# Patient Record
Sex: Female | Born: 1965 | Hispanic: Yes | Marital: Single | Smoking: Never smoker
Health system: Southern US, Community
[De-identification: ages and names within clinical notes are randomized; demographics above are authoritative.]

## PROBLEM LIST (undated history)

## (undated) DIAGNOSIS — E119 Type 2 diabetes mellitus without complications: Secondary | ICD-10-CM

## (undated) DIAGNOSIS — I10 Essential (primary) hypertension: Secondary | ICD-10-CM

## (undated) DIAGNOSIS — C799 Secondary malignant neoplasm of unspecified site: Secondary | ICD-10-CM

## (undated) DIAGNOSIS — C73 Malignant neoplasm of thyroid gland: Secondary | ICD-10-CM

## (undated) HISTORY — PX: THYROIDECTOMY: SHX17

---

## 2006-10-04 DIAGNOSIS — C73 Malignant neoplasm of thyroid gland: Secondary | ICD-10-CM

## 2006-10-04 HISTORY — DX: Malignant neoplasm of thyroid gland: C73

## 2011-03-05 HISTORY — PX: THYROIDECTOMY: SHX17

## 2014-01-16 ENCOUNTER — Encounter (HOSPITAL_COMMUNITY): Payer: Self-pay | Admitting: Emergency Medicine

## 2014-01-16 ENCOUNTER — Other Ambulatory Visit (HOSPITAL_COMMUNITY)
Admission: RE | Admit: 2014-01-16 | Discharge: 2014-01-16 | Disposition: A | Discharge status: Home / Self Care | Payer: No Typology Code available for payment source | Source: Ambulatory Visit | Attending: Emergency Medicine | Admitting: Emergency Medicine

## 2014-01-16 ENCOUNTER — Emergency Department (HOSPITAL_COMMUNITY)
Admission: EM | Admit: 2014-01-16 | Discharge: 2014-01-16 | Disposition: A | Discharge status: Home / Self Care | Payer: No Typology Code available for payment source | Source: Home / Self Care | Attending: Emergency Medicine | Admitting: Emergency Medicine

## 2014-01-16 DIAGNOSIS — Z113 Encounter for screening for infections with a predominantly sexual mode of transmission: Secondary | ICD-10-CM | POA: Insufficient documentation

## 2014-01-16 DIAGNOSIS — N760 Acute vaginitis: Secondary | ICD-10-CM

## 2014-01-16 HISTORY — DX: Essential (primary) hypertension: I10

## 2014-01-16 LAB — POCT URINALYSIS DIP (DEVICE)
Bilirubin Urine: NEGATIVE
GLUCOSE, UA: 500 mg/dL — AB
Ketones, ur: NEGATIVE mg/dL
LEUKOCYTES UA: NEGATIVE
NITRITE: NEGATIVE
PROTEIN: NEGATIVE mg/dL
Specific Gravity, Urine: 1.01 (ref 1.005–1.030)
UROBILINOGEN UA: 0.2 mg/dL (ref 0.0–1.0)
pH: 5.5 (ref 5.0–8.0)

## 2014-01-16 LAB — GLUCOSE, CAPILLARY: GLUCOSE-CAPILLARY: 336 mg/dL — AB (ref 70–99)

## 2014-01-16 MED ORDER — FLUCONAZOLE 150 MG PO TABS: 150.0000 mg | ORAL_TABLET | Freq: Every day | ORAL | Status: DC

## 2014-01-16 MED ORDER — METRONIDAZOLE 500 MG PO TABS: 500.0000 mg | ORAL_TABLET | Freq: Two times a day (BID) | ORAL | Status: DC

## 2014-01-16 NOTE — Discharge Instructions (Signed)
Vaginosis bacteriana (Bacterial Vaginosis) La vaginosis bacteriana es una infeccin vaginal que perturba el equilibrio normal de las bacterias que se encuentran en la vagina. Es el resultado de un crecimiento excesivo de ciertas bacterias. Esta es la infeccin vaginal ms frecuente en mujeres en edad reproductiva. El tratamiento es importante para prevenir complicaciones, especialmente en mujeres embarazadas, dado que puede causar un parto prematuro. [name]  La vaginosis bacteriana se origina por un aumento de bacterias nocivas que, generalmente, estn presentes en cantidades ms pequeas en la vagina. Varios tipos diferentes de bacterias pueden causar esta afeccin. Sin embargo, la causa de su desarrollo no se comprende totalmente. [place] o comportamientos pueden exponerlo a un mayor riesgo de desarrollar vaginosis bacteriana, entre los que se incluyen:  Tener una nueva pareja sexual o mltiples parejas sexuales.  Las duchas vaginales  El uso del DIU (dispositivo intrauterino) como mtodo anticonceptivo. El contagio no se produce en baos, por ropas de cama, en piscinas o por contacto con objetos. SIGNOS Y SNTOMAS  Algunas mujeres que padecen vaginosis bacteriana no presentan signos ni sntomas. Los sntomas ms comunes son:  Secrecin vaginal de color grisceo.  Secrecin vaginal con olor similar al [employer], especialmente despus de Retail banker.  Picazn o sensacin de ardor en la vagina o la vulva.  Ardor o dolor al [employer]. DIAGNSTICO  Su mdico analizar su historia clnica y le examinar la vagina para detectar signos de vaginosis bacteriana. Puede tomarle Truddie Coco de flujo vaginal. Su mdico examinar esta muestra con un microscopio para controlar las bacterias y clulas anormales. Tambin puede realizarse un anlisis del pH vaginal.  TRATAMIENTO  La vaginosis bacteriana puede tratarse con antibiticos, en forma de comprimidos o  de crema vaginal. Puede indicarse una segunda tanda de antibiticos si la afeccin se repite despus del tratamiento.  [place] solo medicamentos de venta libre o recetados, segn las indicaciones del mdico.  Si le han recetado antibiticos, tmelos como se le indic. Asegrese de que finaliza la prescripcin completa aunque se sienta mejor.  No mantenga relaciones sexuales Animator.  Comunique a sus compaeros sexuales que sufre una infeccin vaginal. Deben consultar a su mdico y recibir tratamiento si tienen problemas, como picazn o una erupcin cutnea leve.  Practique el sexo seguro usando preservativos y tenga un nico compaero sexual. SOLICITE ATENCIN MDICA SI:   Sus sntomas no mejoran despus de 3 das de [place].  Aumenta la secrecin o Conservation officer, historic buildings.  Tiene fiebre. ASEGRESE DE QUE:   Comprende estas instrucciones.  Controlar su afeccin.  Recibir ayuda de inmediato si no mejora o si empeora. PARA OBTENER MS INFORMACIN  Centros para el control y la prevencin de Probation officer for Disease Control and Prevention, CDC): App[employer]raud[employer] Asociacin Estadounidense de la Salud Sexual (American Sexual Health Association, SHA): www.ashastd.org  Document Released: 12/28/2007 Document Revised: 07/11/2013 [facility] Patient Information 2014 [place], [name].  Vaginitis  (Vaginitis)  La vaginitis es la inflamacin de la vagina. Generalmente se debe a un cambio en el equilibrio normal de las bacterias y hongos que viven en la vagina. Este cambio en el equilibrio causa un crecimiento excesivo de ciertas bacterias y hongos, lo que causa la inflamacin. Hay diferentes tipos de vaginitis, pero los ms comunes son:   Vaginitis bacteriana.  Infeccin por hongos (candidiasis).  Vaginitis por tricomoniasis. Esta es una enfermedad de transmisin sexual (ETS).  Vaginitis viral.  Vaginitis atrfica.  Vaginitis  alrgica. [name]  El tratamiento depende del tipo de vaginitis. Las causas pueden ser:   Bacterias (vaginitis bacteriana).  Hongos (infeccin por hongos).  Parsitos (vaginitis por tricomoniasis).  Virus (vaginitis viral).  Niveles hormonales bajos (vaginitis atrfica). Los niveles bajos de hormonas pueden ocurrir durante el Media planner, la Transport planner o despus de la menopausia.  Irritantes, como los baos de [place], los tampones perfumados y los aerosoles femeninos (vaginitis IT consultant). Otros factores pueden alterar el equilibrio normal de los hongos y las bacterias que viven en la vagina. Ellos son:   Antibiticos.  Higiene personal deficiente.  Diafragmas, esponjas vaginales, espermicidas, pldoras anticonceptivas y dispositivos intrauterinos (DIU).  [place].  Infecciones.  La diabetes no controlada.  Tener un sistema inmunolgico debilitado. SNTOMAS  Los sntomas pueden variar segn la causa de la vaginitis. Los sntomas ms comunes son:   Flujo vaginal anormal.  La secrecin es de color blanco, gris o amarillento en la vaginitis bacteriana.  La secrecin es espesa, blanca y con apariencia de queso en la infeccin por hongos.  La secrecin es espumosa y de color amarillo o verdoso en la tricomoniasis.  Mal olor vaginal.  En la vaginitis bacteriana puede haber olor a "pescado".  Picazn, dolor o hinchazn vaginal.  Relaciones sexuales dolorosas.  Dolor o ardor al Garment/textile technologist. En ocasiones puede no haber sntomas.  TRATAMIENTO  El tratamiento depende de la gravedad de la lesin.   La vaginitis bacteriana y la tricomoniasis a menudo se tratan con cremas o comprimidos antibiticos.  Las infecciones por hongos se tratan con medicamentos antifngicos, como cremas o supositorios vaginales.  La vaginitis viral no tiene [place], [employer] los sntomas pueden tratarse con medicamentos que [employer]. Su pareja sexual tambin debe ser tratarse.  La  vaginitis atrfica puede tratarse con crema, comprimidos, supositorios, o anillo vaginal con estrgenos. Si tiene sequedad vaginal, los lubricantes y las cremas hidratantes pueden ayudarla. Posiblemente le pedirn que evite los [place], aerosoles o duchas perfumados.  El tratamiento de la vaginitis alrgica implica renunciar al uso del producto que est causando el problema. Las cremas vaginales pueden usarse para tratar los sntomas. [place] todos los medicamentos segn le indic su mdico.  Mantenga la zona vaginal limpia y seca. Evite el jabn y slo enjuague el rea con agua.  Evite la ducha vaginal. Puede eliminar las bacterias saludables que hay en la vagina.  No utilice tampones ni tenga relaciones sexuales hasta que el profesional la autorice. No use apsitos mientras tenga vaginitis.  Higiencese de adelante hacia atrs. Esto evita la propagacin de bacterias desde el recto hacia la vagina.  Deje que el aire llegue a su rea genital.  Use ropa interior de algodn para reducir la acumulacin de humedad.  Evite el uso de ropa interior cuando duerme hasta que la vaginitis haya mejorado.  Evite la ropa interior o medias de nylon ajustadas y que no tengan un panel de algodn.  Qutese la ropa hmeda (especialmente el traje de bao) tan pronto como sea posible.  Utilice productos suaves sin perfume. Evite el uso de sustancias irritantes como:  Aerosoles femeninos perfumados.  Suavizantes de tela.  Detergentes perfumados.  Tampones perfumados.  Jabones o baos de espuma perfumados.  Practique el sexo seguro y use condones. Los condones pueden prevenir la transmisin de la tricomoniasis y la vaginitis viral. Dana Allan ATENCIN MDICA SI:   Siente dolor abdominal.  Tiene fiebre o sntomas persistentes durante ms de 2  3 das.  Tiene fiebre  y los sntomas empeoran repentinamente. Document Released: 01/06/2009 Document Revised:  06/14/2012 [facility] Patient Information 2014 [place], [name].

## 2014-01-16 NOTE — ED Notes (Signed)
Patient c/o 1 week duration of vaginal itching, rectal bleeding (?from hemorrhoids ?) NAD. Also c/o HA, cramping pain in between shoulders

## 2014-01-16 NOTE — ED Provider Notes (Signed)
CSN: [id number]     Arrival date & time 01/16/14  1231 History   First MD Initiated Contact with Patient 01/16/14 1404     Chief Complaint  Patient presents with  . Vaginal Itching   (Consider location/radiation/quality/duration/timing/severity/associated sxs/prior Treatment) HPI Comments: Reports one week of vaginal itching and irritation with associated dysuria. Denies vaginal discharge or vaginal bleeding. Denies fever, chills, flank pain, abdominal pain. States she is not currently receiving any sort of cancer treatments. Is a diabetic and has not yet taken her metformin nor her BP medication for today.  Also wishes to mention a one year history of recurrent headache and discomfort in midportion of her back. States she has discussed this with one of her providers (her oncologist) and she has undergone imaging of these areas of discomfort and was informed that these issues would need to be managed by her PCP.  Has been taking ibuprofen with some relief.  PCP: [facility] Oncologist @ [place] (being treated for thyroid cancer).   Patient is a 48 y.o. female presenting with vaginal itching. The history is provided by the patient. The history is limited by a language barrier. A language interpreter was used.  Vaginal Itching This is a new problem. Episode onset: 1 week. The problem occurs constantly. The problem has not changed since onset.Associated symptoms include headaches.    Past Medical History  Diagnosis Date  . Hypertension    Past Surgical History  Procedure Laterality Date  . Thyroidectomy    . Cesarean section     History reviewed. No pertinent family history. History  Substance Use Topics  . Smoking status: Never Smoker   . Smokeless tobacco: Not on file  . Alcohol Use: No   OB History   Grav Para Term Preterm Abortions TAB SAB Ect Mult Living                 Review of Systems  Constitutional: Negative.   Eyes: Negative.   Respiratory: Negative.   Cardiovascular:  Negative.   Gastrointestinal: Negative.   Endocrine: Negative for polydipsia, polyphagia and polyuria.  Genitourinary: Positive for dysuria. Negative for urgency, frequency, hematuria, flank pain, decreased urine volume, vaginal bleeding, vaginal discharge, genital sores, vaginal pain and pelvic pain.  Musculoskeletal: Positive for back pain.  Skin: Negative.   Neurological: Positive for headaches. Negative for dizziness, seizures, syncope, weakness, light-headedness and numbness.    Allergies  Review of patient's allergies indicates no known allergies.  Home Medications   Prior to Admission medications   Medication Sig Start Date End Date Taking? Authorizing Provider  metFORMIN (GLUCOPHAGE) 1000 MG tablet Take 1,000 mg by mouth 2 (two) times daily with a meal.   Yes Historical Provider, MD  olmesartan (BENICAR) 20 MG tablet Take 20 mg by mouth daily.   Yes Historical Provider, MD  thyroid (ARMOUR) 30 MG tablet Take 30 mg by mouth daily before breakfast.   Yes Historical Provider, MD   BP 175/110  Pulse 73  Temp(Src) 98.2 F (36.8 C) (Oral)  Resp 16  SpO2 97% Physical Exam  Nursing note and vitals reviewed. Constitutional: She is oriented to person, place, and time. She appears well-developed and well-nourished. No distress.  HENT:  Head: Normocephalic and atraumatic.  Eyes: Conjunctivae and EOM are normal. Pupils are equal, round, and reactive to light. Right eye exhibits no discharge. Left eye exhibits no discharge. No scleral icterus.  Neck: Normal range of motion. Neck supple.  Cardiovascular: Normal rate, regular rhythm and normal heart sounds.  Pulmonary/Chest: Effort normal and breath sounds normal.  Abdominal: Soft. Bowel sounds are normal. She exhibits no distension and no mass. There is no tenderness.  Genitourinary: Uterus normal. Pelvic exam was performed with patient supine. There is no rash, tenderness or lesion on the right labia. There is no rash, tenderness or  lesion on the left labia. Cervix exhibits no motion tenderness, no discharge and no friability. Right adnexum displays no mass, no tenderness and no fullness. Left adnexum displays no mass, no tenderness and no fullness. No erythema, tenderness or bleeding around the vagina. No foreign body around the vagina. No signs of injury around the vagina. Vaginal discharge found.  Slight thin grey discharge.  Musculoskeletal: Normal range of motion.  Lymphadenopathy:    She has no cervical adenopathy.  Neurological: She is alert and oriented to person, place, and time. No cranial nerve deficit. Coordination normal.  Skin: Skin is warm and dry. No rash noted.  Psychiatric: She has a normal mood and affect. Her behavior is normal.    ED Course  Procedures (including critical care time) Labs Review Labs Reviewed  GLUCOSE, CAPILLARY - Abnormal; Notable for the following:    Glucose-Capillary 336 (*)    All other components within normal limits  POCT URINALYSIS DIP (DEVICE) - Abnormal; Notable for the following:    Glucose, UA 500 (*)    Hgb urine dipstick SMALL (*)    All other components within normal limits  CERVICOVAGINAL ANCILLARY ONLY    Results for orders placed during the hospital encounter of 01/16/14  GLUCOSE, CAPILLARY      Result Value Ref Range   Glucose-Capillary 336 (*) 70 - 99 mg/dL  POCT URINALYSIS DIP (DEVICE)      Result Value Ref Range   Glucose, UA 500 (*) NEGATIVE mg/dL   Bilirubin Urine NEGATIVE  NEGATIVE   Ketones, ur NEGATIVE  NEGATIVE mg/dL   Specific Gravity, Urine 1.010  1.005 - 1.030   Hgb urine dipstick SMALL (*) NEGATIVE   pH 5.5  5.0 - 8.0   Protein, ur NEGATIVE  NEGATIVE mg/dL   Urobilinogen, UA 0.2  0.0 - 1.0 mg/dL   Nitrite NEGATIVE  NEGATIVE   Leukocytes, UA NEGATIVE  NEGATIVE   Imaging Review No results found.   MDM   1. Vaginitis   Will treat for yeast and BV and contact patient if labs indicate need for additional treatment. Advised close  follow up with PCP if symptoms persist.    Lahoma Rocker, PA 01/16/14 [phone]

## 2014-01-17 LAB — CERVICOVAGINAL ANCILLARY ONLY
CHLAMYDIA, DNA PROBE: NEGATIVE
Neisseria Gonorrhea: NEGATIVE
Wet Prep (BD Affirm): NEGATIVE
Wet Prep (BD Affirm): NEGATIVE
Wet Prep (BD Affirm): POSITIVE — AB

## 2014-01-17 NOTE — ED Provider Notes (Signed)
Medical screening examination/treatment/procedure(s) were performed by non-physician practitioner and as supervising physician I was immediately available for consultation/collaboration.  Jadine Brumley, M.D.  Zeki Bedrosian C Candace Ramus, MD 01/17/14 1447 

## 2014-01-18 NOTE — ED Notes (Signed)
GC/Chlamydia neg., Affirm: Candida and Trich neg., Gardnerella pos.  Pt. adequately treated with Flagyl. Hanley Seamen [facility] 01/18/2014

## 2014-04-22 ENCOUNTER — Ambulatory Visit: Payer: No Typology Code available for payment source | Admitting: Family Medicine

## 2014-05-03 ENCOUNTER — Ambulatory Visit: Payer: No Typology Code available for payment source | Attending: Internal Medicine | Admitting: Internal Medicine

## 2014-05-03 ENCOUNTER — Encounter: Payer: Self-pay | Admitting: Internal Medicine

## 2014-05-03 VITALS — BP 150/90 | HR 77 | Temp 98.4°F | Resp 16 | Wt 140.4 lb

## 2014-05-03 DIAGNOSIS — E1165 Type 2 diabetes mellitus with hyperglycemia: Secondary | ICD-10-CM | POA: Insufficient documentation

## 2014-05-03 DIAGNOSIS — M545 Low back pain, unspecified: Secondary | ICD-10-CM

## 2014-05-03 DIAGNOSIS — E139 Other specified diabetes mellitus without complications: Secondary | ICD-10-CM

## 2014-05-03 DIAGNOSIS — M62830 Muscle spasm of back: Secondary | ICD-10-CM | POA: Insufficient documentation

## 2014-05-03 DIAGNOSIS — G8929 Other chronic pain: Secondary | ICD-10-CM | POA: Insufficient documentation

## 2014-05-03 DIAGNOSIS — Z85850 Personal history of malignant neoplasm of thyroid: Secondary | ICD-10-CM | POA: Insufficient documentation

## 2014-05-03 DIAGNOSIS — H538 Other visual disturbances: Secondary | ICD-10-CM | POA: Insufficient documentation

## 2014-05-03 DIAGNOSIS — I10 Essential (primary) hypertension: Secondary | ICD-10-CM

## 2014-05-03 DIAGNOSIS — E089 Diabetes mellitus due to underlying condition without complications: Secondary | ICD-10-CM

## 2014-05-03 DIAGNOSIS — E039 Hypothyroidism, unspecified: Secondary | ICD-10-CM

## 2014-05-03 DIAGNOSIS — M5380 Other specified dorsopathies, site unspecified: Secondary | ICD-10-CM

## 2014-05-03 DIAGNOSIS — IMO0001 Reserved for inherently not codable concepts without codable children: Secondary | ICD-10-CM

## 2014-05-03 DIAGNOSIS — R030 Elevated blood-pressure reading, without diagnosis of hypertension: Secondary | ICD-10-CM

## 2014-05-03 DIAGNOSIS — Z139 Encounter for screening, unspecified: Secondary | ICD-10-CM | POA: Insufficient documentation

## 2014-05-03 LAB — GLUCOSE, POCT (MANUAL RESULT ENTRY): POC Glucose: 110 mg/dl — AB (ref 70–99)

## 2014-05-03 LAB — POCT GLYCOSYLATED HEMOGLOBIN (HGB A1C): HEMOGLOBIN A1C: 6.2

## 2014-05-03 MED ORDER — CYCLOBENZAPRINE HCL 10 MG PO TABS: 10.0000 mg | ORAL_TABLET | Freq: Three times a day (TID) | ORAL | Status: DC | PRN

## 2014-05-03 MED ORDER — CLONIDINE HCL 0.1 MG PO TABS
0.1000 mg | ORAL_TABLET | Freq: Once | ORAL | Status: AC
  Administered 2014-05-03: 0.1 mg via ORAL

## 2014-05-03 MED ORDER — LEVOTHYROXINE SODIUM 200 MCG PO TABS: 200.0000 ug | ORAL_TABLET | Freq: Every day | ORAL | Status: DC

## 2014-05-03 MED ORDER — METFORMIN HCL 1000 MG PO TABS: 1000.0000 mg | ORAL_TABLET | Freq: Two times a day (BID) | ORAL | Status: DC

## 2014-05-03 MED ORDER — FREESTYLE SYSTEM KIT: 1.0000 | PACK | Freq: Three times a day (TID) | Status: DC

## 2014-05-03 MED ORDER — OLMESARTAN MEDOXOMIL 20 MG PO TABS: 20.0000 mg | ORAL_TABLET | Freq: Every day | ORAL | Status: DC

## 2014-05-03 NOTE — Patient Instructions (Signed)
Plan de alimentacin DASH (DASH Eating Plan) DASH es la sigla en ingls de "Enfoques Alimentarios para Detener la Hipertensin". El plan de alimentacin DASH ha demostrado bajar la presin arterial elevada (hipertensin). Los beneficios adicionales para la salud pueden incluir la disminucin del riesgo de diabetes mellitus tipo2, enfermedades cardacas e ictus. Este plan tambin puede ayudar a Horticulturist, commercial. QU DEBO SABER ACERCA DEL PLAN DE ALIMENTACIN DASH? Para el plan de alimentacin DASH, seguir las siguientes pautas generales:  Elija los alimentos con un valor porcentual diario de sodio de menos del 5% (segn figura en la etiqueta del alimento).  Use hierbas o aderezos sin sal, en lugar de sal de mesa o sal marina.  Consulte al mdico o farmacutico antes de usar sustitutos de la sal.  Coma productos con bajo contenido de sodio, cuya etiqueta suele decir "bajo contenido de sodio" o "sin agregado de sal".  Coma alimentos frescos.  Coma ms verduras, frutas y productos lcteos con bajo contenido de [place].  Elija los cereales integrales. Busque la palabra "integral" en Equities trader de la lista de ingredientes.  Elija el pescado y el pollo o el pavo sin piel ms a menudo que las carnes rojas. Limite el consumo de pescado, carne de ave y carne a 6onzas (170g) por Training and development officer.  Limite el consumo de dulces, postres, azcares y bebidas azucaradas.  Elija las grasas saludables para el corazn.  Limite el consumo de queso a 1onza (28g) por Training and development officer.  Consuma ms comida casera y menos de restaurante, de buf y comida rpida.  Limite el consumo de alimentos fritos.  Cocine los alimentos utilizando mtodos que no sean la fritura.  Limite las verduras enlatadas. Si las consume, enjuguelas bien para disminuir el sodio.  Cuando coma en un restaurante, pida que preparen su comida con menos sal o, en lo posible, sin nada de sal. QU ALIMENTOS PUEDO COMER? Pida ayuda a un nutricionista para  conocer las necesidades calricas individuales. Cereales Pan de salvado o integral. Arroz integral. Pastas de salvado o integrales. Quinua, trigo burgol y cereales integrales. Cereales con bajo contenido de sodio. Tortillas de harina de maz o de salvado. Pan de maz integral. Galletas saladas integrales. Galletas con bajo contenido de [place]. Vegetales Verduras frescas o congeladas (crudas, al vapor, asadas o grilladas). Jugos de tomate y verduras con contenido bajo o reducido de sodio. Pasta y salsa de tomate con contenido bajo o [place]. Verduras enlatadas con bajo contenido de sodio o reducido de sodio.  Lambert Mody Lambert Mody frescas, en conserva (en su jugo natural) o frutas congeladas. Carnes y otros productos con protenas Carne de res molida (al 85% o ms [place]), carne de res de animales alimentados con pastos o carne de res sin la grasa. Pollo o pavo sin piel. Carne de pollo o de [place]. Cerdo sin la grasa. Todos los pescados y frutos de mar. Huevos. Porotos, guisantes o lentejas secos. Frutos secos y semillas sin sal. Frijoles enlatados sin sal. Lcteos Productos lcteos con bajo contenido de grasas, como [place] o al 1%, quesos reducidos en grasas o al 2%, ricota con bajo contenido de grasas o [employer], o yogur natural con bajo contenido de [place]. Quesos con contenido bajo o reducido de sodio. Grasas y Naval architect en barra que no contengan grasas trans. [place] y alios para ensaladas livianos o reducidos en grasas (reducidos en sodio). [place]. Aceites de crtamo, oliva o canola. [place] natural de man o almendra. Otros Palomitas de maz y pretzels sin sal.  Los artculos mencionados arriba pueden no ser [employer] de las bebidas o los alimentos recomendados. Comunquese con el nutricionista para conocer ms opciones. QU ALIMENTOS NO SE RECOMIENDAN? Cereales Pan blanco. Pastas blancas. Arroz blanco. Pan de maz refinado. Bagels y  croissants. Galletas saladas que contengan grasas trans. Vegetales Vegetales con crema o fritos. Verduras en [place]. Verduras enlatadas comunes. Pasta y salsa de tomate en lata comunes. Jugos comunes de tomate y de verduras. Lambert Mody Frutas secas. Fruta enlatada en almbar liviano o espeso. Jugo de frutas. Carnes y otros productos con protenas [place] de carne con Lobbyist. Costillas, alas de pollo, tocineta, salchicha, mortadela, salame, chinchulines, tocino, perros calientes, salchichas alemanas y embutidos envasados. Frutos secos y semillas con sal. Frijoles con sal en lata. Lcteos Leche entera o al 2%, crema, mezcla de [place] y crema, y queso crema. Yogur entero o endulzado. Quesos o queso azul con alto contenido de Physicist, medical. [place] no lcteas y coberturas batidas. Quesos procesados, quesos para untar o cuajadas. Condimentos Sal de cebolla y ajo, sal condimentada, sal de mesa y sal marina. Salsas en lata y envasadas. Salsa [place]. Salsa trtara. Salsa barbacoa. Salsa teriyaki. Salsa de soja, incluso la que tiene contenido reducido de [place]. Salsa de carne. Salsa de pescado. Salsa de [place]. Salsa rosada. Rbano picante. Ketchup y mostaza. Saborizantes y tiernizantes para carne. Caldo en cubitos. Salsa picante. Salsa tabasco. Adobos. Aderezos para tacos. Salsas. Grasas y aceites [place], [place] en barra, Sylvarena de [place], [place], [place] clarificada y Wendee Copp de tocino. Aceites de coco, de palmiste o de palma. Aderezos comunes para ensalada. Otros Pickles y [place]. Palomitas de maz y pretzels con sal. Los artculos mencionados arriba pueden no ser [employer] de las bebidas y los alimentos que se [name]. Comunquese con el nutricionista para obtener ms informacin. DNDE Dolan Amen MS INFORMACIN? [place], del Pulmn y de la Sangre (National Heart, Lung, and [place]):  [email or web address] Document Released: 09/09/2011 Document Revised: 02/04/2014 [facility] Patient Information 2015 [place], [name]. This information is not intended to replace advice given to you by your health care provider. Make sure you discuss any questions you have with your health care provider. La diabetes mellitus y los alimentos (Diabetes Mellitus and Food) Es importante que controle su nivel de azcar en la sangre (glucosa). El nivel de glucosa en sangre depende en gran medida de lo que usted come. Comer alimentos saludables en las cantidades [place] a lo largo del Training and development officer, aproximadamente a la misma hora [employer], lo ayudar a Chief Technology Officer su nivel de Multimedia programmer. Tambin puede ayudarlo a retrasar o Patent attorney de la diabetes mellitus. Comer de [employer] saludable incluso puede ayudarlo a Chartered loss adjuster de presin arterial y a Science writer o Theatre manager un peso saludable.  CMO PUEDEN AFECTARME LOS ALIMENTOS? Carbohidratos Los carbohidratos afectan el nivel de glucosa en sangre ms que cualquier otro tipo de alimento. El nutricionista lo ayudar a Teacher, adult education cuntos carbohidratos puede consumir en cada comida y ensearle a contarlos. El recuento de carbohidratos es importante para mantener la glucosa en sangre en un nivel saludable, en especial si utiliza insulina o toma determinados medicamentos para la diabetes mellitus. Alcohol El alcohol puede provocar disminuciones sbitas de la glucosa en sangre (hipoglucemia), en especial si utiliza insulina o toma determinados medicamentos para la diabetes mellitus. La hipoglucemia es una afeccin que puede poner en peligro la vida. Los sntomas de la hipoglucemia (somnolencia, mareos y Data processing manager) son similares a los sntomas de  haber consumido mucho alcohol.  Si el mdico lo autoriza a beber alcohol, hgalo con moderacin y siga estas pautas:  Las mujeres no deben beber ms de un trago por da, y los  hombres no deben beber ms de dos tragos por da. Un trago es igual a:  12 onzas (355 ml) de cerveza  5 onzas de vino (150 ml) de vino  1,5onzas (45ml) de bebidas espirituosas  No beba con el estmago vaco.  Mantngase hidratado. Beba agua, gaseosas dietticas o t helado sin azcar.  Las gaseosas comunes, los jugos y otros refrescos podran contener muchos carbohidratos y se deben contar. QU ALIMENTOS NO SE RECOMIENDAN? Cuando haga las elecciones de alimentos, es importante que recuerde que todos los alimentos son distintos. Algunos tienen menos nutrientes que otros por porcin, aunque podran tener la misma cantidad de caloras o carbohidratos. Es difcil darle al cuerpo lo que necesita cuando consume alimentos con menos nutrientes. Estos son algunos ejemplos de alimentos que debera evitar ya que contienen muchas caloras y carbohidratos, pero pocos nutrientes:  Grasas trans (la mayora de los alimentos procesados incluyen grasas trans en la etiqueta de Informacin nutricional).  Gaseosas comunes.  Jugos.  Caramelos.  Dulces, como tortas, pasteles, rosquillas y galletas.  Comidas fritas. QU ALIMENTOS PUEDO COMER? Consuma alimentos ricos en nutrientes, que nutrirn el cuerpo y lo mantendrn saludable. Los alimentos que debe comer tambin dependern de varios factores, como:  Las caloras que necesita.  Los medicamentos que toma.  Su peso.  El nivel de glucosa en sangre.  El nivel de presin arterial.  El nivel de colesterol. Tambin debe consumir una variedad de alimentos, como:  Protenas, como carne, aves, pescado, tofu, frutos secos y semillas (las protenas de animales magros son mejores).  Frutas.  Verduras.  Productos lcteos, como leche, queso y yogur (descremados son mejores).  Panes, granos, pastas, cereales, arroz y frijoles.  Grasas, como aceite de oliva, margarina sin grasas trans, aceite de canola, aguacate y aceitunas. TODOS LOS QUE  PADECEN DIABETES MELLITUS TIENEN EL MISMO PLAN DE COMIDAS? Dado que todas las personas que padecen diabetes mellitus son distintas, no hay un solo plan de comidas que funcione para todos. Es muy importante que se rena con un nutricionista que lo ayudar a crear un plan de comidas adecuado para usted. Document Released: 12/28/2007 Document Revised: 09/25/2013 ExitCare Patient Information 2015 [employer]. This information is not intended to replace advice given to you by your health care provider. Make sure you discuss any questions you have with your health care provider.  

## 2014-05-03 NOTE — Progress Notes (Signed)
Patient Demographics  Jenny [name], is a 48 y.o. female  PIR:[id number]  YTK:[id number]  DOB - 04/15/1966  CC:  Chief Complaint  Patient presents with  . Establish Care       HPI: Jenny [name] is a 48 y.o. female here today to establish medical care. History of thyroid cancer status post surgery on neck a few years ago currently patient following up with oncologist at [facility], patient is currently taking levothyroxine 200 mcg daily, she also history of diabetes hypertension and she ran out of her medications for the last 2 months today her blood pressure is elevated, she was given clonidine, repeat manual blood pressure is 150/90 she does report blurry vision on and off denies any headache dizziness chest and shortness of breath, patient is complaining of lower back pain for a few weeks denies any incontinence, she has been taking over-the-counter ibuprofen with some improvement in the symptoms. Patient has No headache, No chest pain, No abdominal pain - No Nausea, No new weakness tingling or numbness, No Cough - SOB.  No Known Allergies Past Medical History  Diagnosis Date  . Hypertension   . Cancer    Current Outpatient Prescriptions on File Prior to Visit  Medication Sig Dispense Refill  . fluconazole (DIFLUCAN) 150 MG tablet Take 1 tablet (150 mg total) by mouth daily. For one dose  1 tablet  0  . metroNIDAZOLE (FLAGYL) 500 MG tablet Take 1 tablet (500 mg total) by mouth 2 (two) times daily. X 7 days  14 tablet  0  . thyroid (ARMOUR) 30 MG tablet Take 30 mg by mouth daily before breakfast.       No current facility-administered medications on file prior to visit.   Family History  Problem Relation Age of Onset  . Diabetes Mother   . Hypertension Mother   . Diabetes Sister   . Cancer Sister    History   Social History  . Marital Status: Single    Spouse Name: N/A    Number of Children: N/A  . Years of Education: N/A   Occupational History  . Not on  file.   Social History Main Topics  . Smoking status: Never Smoker   . Smokeless tobacco: Not on file  . Alcohol Use: No  . Drug Use: Not on file  . Sexual Activity: Not on file   Other Topics Concern  . Not on file   Social History Narrative  . No narrative on file    Review of Systems: Constitutional: Negative for fever, chills, diaphoresis, activity change, appetite change and fatigue. HENT: Negative for ear pain, nosebleeds, congestion, facial swelling, rhinorrhea, neck pain, neck stiffness and ear discharge.  Eyes: Negative for pain, discharge, redness, itching and visual disturbance. Respiratory: Negative for cough, choking, chest tightness, shortness of breath, wheezing and stridor.  Cardiovascular: Negative for chest pain, palpitations and leg swelling. Gastrointestinal: Negative for abdominal distention. Genitourinary: Negative for dysuria, urgency, frequency, hematuria, flank pain, decreased urine volume, difficulty urinating and dyspareunia.  Musculoskeletal: Negative for back pain, joint swelling, arthralgia and gait problem. Neurological: Negative for dizziness, tremors, seizures, syncope, facial asymmetry, speech difficulty, weakness, light-headedness, numbness and headaches.  Hematological: Negative for adenopathy. Does not bruise/bleed easily. Psychiatric/Behavioral: Negative for hallucinations, behavioral problems, confusion, dysphoric mood, decreased concentration and agitation.    Objective:   Filed Vitals:   05/03/14 1535  BP: 150/90  Pulse:   Temp:   Resp:     Physical Exam: Constitutional: Patient  appears well-developed and well-nourished. No distress. HENT: Normocephalic, atraumatic, External right and left ear normal. Oropharynx is clear and moist.  Eyes: Conjunctivae and EOM are normal. PERRLA, no scleral icterus. Neck: Normal ROM. Neck supple. No JVD. No tracheal deviation. Old surgical scar on the neck CVS: RRR, S1/S2 +, no murmurs, no gallops,  no carotid bruit.  Pulmonary: Effort and breath sounds normal, no stridor, rhonchi, wheezes, rales.  Abdominal: Soft. BS +, no distension, tenderness, rebound or guarding.  Musculoskeletal: Normal range of motion. No edema and no tenderness. Lower spinal and paraspinal tenderness, with SLR test patient is complaining of back discomfort.  Neuro: Alert. Normal reflexes, muscle tone coordination. No cranial nerve deficit. Skin: Skin is warm and dry. No rash noted. Not diaphoretic. No erythema. No pallor. Psychiatric: Normal mood and affect. Behavior, judgment, thought content normal.  No results found for this basename: WBC, HGB, HCT, MCV, PLT   No results found for this basename: CREATININE, BUN, NA, K, CL, CO2    No results found for this basename: HGBA1C   Lipid Panel  No results found for this basename: chol, trig, hdl, cholhdl, vldl, ldlcalc       Assessment and plan:   1. Elevated blood pressure  - cloNIDine (CATAPRES) tablet 0.1 mg; Take 1 tablet (0.1 mg total) by mouth once. Repeat manual blood pressure is 150/90  2. 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 - Glucose (CBG) - HgB A1c - metFORMIN (GLUCOPHAGE) 1000 MG tablet; Take 1 tablet (1,000 mg total) by mouth 2 (two) times daily with a meal.  Dispense: 60 tablet; Refill: 3 - COMPLETE METABOLIC PANEL WITH GFR; Future - glucose monitoring kit (FREESTYLE) monitoring kit; 1 each by Does not apply route 4 (four) times daily - after meals and at bedtime. 1 month Diabetic Testing Supplies for QAC-QHS accuchecks.  Dispense: 1 each; Refill: 1  3. Essential hypertension, benign Resume back on Benicar, also advise for DASH diet. - olmesartan (BENICAR) 20 MG tablet; Take 1 tablet (20 mg total) by mouth daily.  Dispense: 30 tablet; Refill: 3  4. Unspecified  hypothyroidism Will resume patient back on levothyroxine and check her TSH 3 months. - levothyroxine (SYNTHROID, LEVOTHROID) 200 MCG tablet; Take 1 tablet (200 mcg total) by mouth daily before breakfast.  Dispense: 30 tablet; Refill: 3  5. History of thyroid cancer Currently following up with oncologist  6. Back muscle spasm Advised patient to apply heating pad, prescribed Flexeril - cyclobenzaprine (FLEXERIL) 10 MG tablet; Take 1 tablet (10 mg total) by mouth 3 (three) times daily as needed for muscle spasms.  Dispense: 30 tablet; Refill: 1  7. Chronic low back pain  - DG Lumbar Spine Complete; Future  8. Screening Ordered baseline fasting blood work - MM DIGITAL SCREENING BILATERAL; Future - Lipid panel; Future - TSH; Future - Vit D  25 hydroxy (rtn osteoporosis monitoring); Future - CBC with Differential; Future - COMPLETE METABOLIC PANEL WITH GFR; Future  9. Blurry vision  - Ambulatory referral to Ophthalmology      Health Maintenance  -Mammogram: ordered    Return in about 3 months (around 08/03/2014) for diabetes, hypertension, hypothyroid, BP check in 2 weeks/Nurse Visit.   Lorayne Marek, MD

## 2014-05-03 NOTE — Progress Notes (Signed)
Patient here with interpreter Here to establish care Has history of cancer that has metastasized to  Several areas Thyroid has been removed

## 2014-10-21 ENCOUNTER — Encounter (HOSPITAL_COMMUNITY): Payer: Self-pay | Admitting: *Deleted

## 2014-10-21 ENCOUNTER — Inpatient Hospital Stay (HOSPITAL_COMMUNITY)
Admission: EM | Admit: 2014-10-21 | Discharge: 2014-10-24 | DRG: 180 | Disposition: A | Discharge status: Home / Self Care | Payer: Medicaid Other | Attending: Internal Medicine | Admitting: Internal Medicine

## 2014-10-21 ENCOUNTER — Emergency Department (HOSPITAL_COMMUNITY): Payer: Medicaid Other

## 2014-10-21 DIAGNOSIS — C799 Secondary malignant neoplasm of unspecified site: Secondary | ICD-10-CM

## 2014-10-21 DIAGNOSIS — R079 Chest pain, unspecified: Secondary | ICD-10-CM | POA: Diagnosis present

## 2014-10-21 DIAGNOSIS — J189 Pneumonia, unspecified organism: Secondary | ICD-10-CM | POA: Diagnosis present

## 2014-10-21 DIAGNOSIS — R7989 Other specified abnormal findings of blood chemistry: Secondary | ICD-10-CM

## 2014-10-21 DIAGNOSIS — C73 Malignant neoplasm of thyroid gland: Secondary | ICD-10-CM

## 2014-10-21 DIAGNOSIS — E1165 Type 2 diabetes mellitus with hyperglycemia: Secondary | ICD-10-CM | POA: Diagnosis present

## 2014-10-21 DIAGNOSIS — Z85850 Personal history of malignant neoplasm of thyroid: Secondary | ICD-10-CM

## 2014-10-21 DIAGNOSIS — C7800 Secondary malignant neoplasm of unspecified lung: Principal | ICD-10-CM | POA: Diagnosis present

## 2014-10-21 DIAGNOSIS — I10 Essential (primary) hypertension: Secondary | ICD-10-CM | POA: Diagnosis present

## 2014-10-21 DIAGNOSIS — Z79899 Other long term (current) drug therapy: Secondary | ICD-10-CM

## 2014-10-21 DIAGNOSIS — Z9221 Personal history of antineoplastic chemotherapy: Secondary | ICD-10-CM

## 2014-10-21 DIAGNOSIS — E089 Diabetes mellitus due to underlying condition without complications: Secondary | ICD-10-CM

## 2014-10-21 DIAGNOSIS — E119 Type 2 diabetes mellitus without complications: Secondary | ICD-10-CM | POA: Diagnosis present

## 2014-10-21 DIAGNOSIS — R778 Other specified abnormalities of plasma proteins: Secondary | ICD-10-CM

## 2014-10-21 HISTORY — DX: Malignant neoplasm of thyroid gland: C73

## 2014-10-21 HISTORY — DX: Type 2 diabetes mellitus without complications: E11.9

## 2014-10-21 HISTORY — DX: Secondary malignant neoplasm of unspecified site: C79.9

## 2014-10-21 LAB — BASIC METABOLIC PANEL
ANION GAP: 9 (ref 5–15)
BUN: 10 mg/dL (ref 6–23)
CALCIUM: 9.4 mg/dL (ref 8.4–10.5)
CO2: 30 mmol/L (ref 19–32)
Chloride: 93 mEq/L — ABNORMAL LOW (ref 96–112)
Creatinine, Ser: 0.59 mg/dL (ref 0.50–1.10)
GFR calc non Af Amer: >90 mL/min (ref >90)
Glucose, Bld: 550 mg/dL — ABNORMAL HIGH (ref 70–99)
POTASSIUM: 4 mmol/L (ref 3.5–5.1)
Sodium: 132 mmol/L — ABNORMAL LOW (ref 135–145)

## 2014-10-21 LAB — CBC
HEMATOCRIT: 41.6 % (ref 36.0–46.0)
Hemoglobin: 15.3 g/dL — ABNORMAL HIGH (ref 12.0–15.0)
MCH: 31.2 pg (ref 26.0–34.0)
MCHC: 36.8 g/dL — ABNORMAL HIGH (ref 30.0–36.0)
MCV: 84.9 fL (ref 78.0–100.0)
PLATELETS: 188 10*3/uL (ref 150–400)
RBC: 4.9 MIL/uL (ref 3.87–5.11)
RDW: 11.7 % (ref 11.5–15.5)
WBC: 6.3 10*3/uL (ref 4.0–10.5)

## 2014-10-21 LAB — CBG MONITORING, ED: GLUCOSE-CAPILLARY: 360 mg/dL — AB (ref 70–99)

## 2014-10-21 LAB — TROPONIN I: TROPONIN I: 0.06 ng/mL — AB (ref <0.031)

## 2014-10-21 MED ORDER — IOHEXOL 350 MG/ML SOLN
100.0000 mL | Freq: Once | INTRAVENOUS | Status: AC | PRN
  Administered 2014-10-21: 100 mL via INTRAVENOUS

## 2014-10-21 MED ORDER — ASPIRIN 325 MG PO TABS
325.0000 mg | ORAL_TABLET | Freq: Once | ORAL | Status: AC
  Administered 2014-10-21: 325 mg via ORAL
  Filled 2014-10-21: qty 1

## 2014-10-21 MED ORDER — SODIUM CHLORIDE 0.9 % IV BOLUS (SEPSIS)
1000.0000 mL | Freq: Once | INTRAVENOUS | Status: AC
  Administered 2014-10-21: 1000 mL via INTRAVENOUS

## 2014-10-21 NOTE — ED Provider Notes (Signed)
CSN: [id number]     Arrival date & time 10/21/14  2119 History   First MD Initiated Contact with Patient 10/21/14 2155     Chief Complaint  Patient presents with  . Chest Pain     (Consider location/radiation/quality/duration/timing/severity/associated sxs/prior Treatment) The history is provided by the patient. A language interpreter was used ([place]).  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 Patient reported that she was relaxing when she noted the pain. Stated that the pain is intense and that she has used nothing for the pain. Reported that she has been experiencing shortness of breath associated with the pain. Stated that since this afternoon she has been having increased pain to the chest. Stated that at times the pain does radiate to the back inbetween her shoulder blades. Patient reported that she has been having a headache that started with the chest pain - stated that this headache is a typical headache that she normally gets - stated that there is a pressure behind her eyes. Stated that the headache started at 7:30 PM and has not changed - has not used anything for the pain. Stated that she has history of thyroid cancer primarily-reported that she did have chemoradiation approximately year ago in [place]. Denies oncologist here in [place]. Denied heavy lifting, fall, injury, breast pain, difficulty swallowing, fever, chills, nausea, vomiting, abdominal pain, weakness, numbness, tingling. PCP Dr. Annitta Needs     Past Medical History  Diagnosis Date  . Hypertension   . Cancer   . Diabetes mellitus without complication    Past Surgical History  Procedure Laterality Date  . Thyroidectomy    . Cesarean section     Family History  Problem Relation  Age of Onset  . Diabetes Mother   . Hypertension Mother   . Diabetes Sister   . Cancer Sister    History  Substance Use Topics  . Smoking status: Never Smoker   . Smokeless tobacco: Not on file  . Alcohol Use: No   OB History    No data available     Review of Systems  Constitutional: Negative for fever and chills.  Eyes: Positive for photophobia. Negative for visual disturbance.  Respiratory: Positive for shortness of breath. Negative for chest tightness.   Cardiovascular: Positive for chest pain.  Gastrointestinal: Negative for nausea, vomiting and abdominal pain.  Musculoskeletal: Positive for back pain and neck pain. Negative for neck stiffness.  Neurological: Positive for headaches. Negative for dizziness, weakness and numbness.      Allergies  Review of patient's allergies indicates no known allergies.  Home Medications   Prior to Admission medications   Medication Sig Start Date End Date Taking? Authorizing Provider  cyclobenzaprine (FLEXERIL) 10 MG tablet Take 1 tablet (10 mg total) by mouth 3 (three) times daily as needed for muscle spasms. 05/03/14   Lorayne Marek, MD  fluconazole (DIFLUCAN) 150 MG tablet Take 1 tablet (150 mg total) by mouth daily. For one dose 01/16/14   Audelia Hives Presson, PA  glucose monitoring kit (FREESTYLE) monitoring kit 1 each by Does not apply route 4 (four) times daily - after meals and at bedtime. 1 month Diabetic Testing Supplies for QAC-QHS accuchecks. 05/03/14   Lorayne Marek, MD  levothyroxine (SYNTHROID, LEVOTHROID) 200 MCG tablet Take 1 tablet (200 mcg total) by mouth daily before breakfast. 05/03/14   Lorayne Marek, MD  metFORMIN (GLUCOPHAGE) 1000 MG tablet Take 1 tablet (1,000 mg total) by mouth 2 (two) times daily with a meal. 05/03/14   Lorayne Marek, MD  metroNIDAZOLE (FLAGYL) 500 MG tablet Take 1 tablet (500 mg total) by mouth 2 (two) times daily. X 7 days 01/16/14   Audelia Hives Presson, PA  olmesartan (BENICAR) 20 MG tablet  Take 1 tablet (20 mg total) by mouth daily. 05/03/14   Lorayne Marek, MD  thyroid (ARMOUR) 30 MG tablet Take 30 mg by mouth daily before breakfast.    Historical Provider, MD   BP 200/94 mmHg  Pulse 70  Temp(Src) 97.7 F (36.5 C) (Oral)  Resp 17  Wt 140 lb (63.504 kg)  SpO2 98% Physical Exam  Constitutional: She is oriented to person, place, and time. She appears well-developed and well-nourished. No distress.  HENT:  Head: Normocephalic and atraumatic.  Mouth/Throat: Oropharynx is clear and moist. No oropharyngeal exudate.  Eyes: Conjunctivae and EOM are normal. Pupils are equal, round, and reactive to light. Right eye exhibits no discharge. Left eye exhibits no discharge.  Patient refuses to open eye secondary to pain and tearing.   Neck: Normal range of motion. Neck supple. No tracheal deviation present.  Negative neck stiffness Negative nuchal rigidity  Negative cervical lymphadenopathy  Negative meningeal signs  Cardiovascular: Normal rate, regular rhythm and normal heart sounds.  Exam reveals no friction rub.   No murmur heard. Pulmonary/Chest: Effort normal and breath sounds normal. No respiratory distress. She has no wheezes. She has no rales. She exhibits tenderness.    Patient is able to speak in full sentences without difficulty  Negative use of accessory muscles Negative stridor  Negative deformities, erythema, inflammation, lesions, sores, ecchymosis identified to chest wall. Pain reproducible upon palpation to left side of the chest.  Musculoskeletal: Normal range of motion.  Full ROM to upper and lower extremities without difficulty noted, negative ataxia noted.  Lymphadenopathy:    She has no cervical adenopathy.  Neurological: She is alert and oriented to person, place, and time. No cranial nerve deficit. She exhibits normal muscle tone. Coordination normal.  Cranial nerves III-XII grossly intact Strength 5+/5+ to upper and lower extremities bilaterally with  resistance applied, equal distribution noted Equal grip strength bilaterally Negative facial droop Negative slurred speech Negative aphasia Patient is able to bring finger to nose bilaterally without difficulty or ataxia Negative arm drift Patient follows commands well Patient responds to questions appropriately GCS 15  Skin: Skin is warm and dry. No rash noted. She is not diaphoretic. No erythema.  Psychiatric: She has a normal mood and affect. Her behavior is normal. Thought content normal.  Nursing note and vitals reviewed.   ED Course  Procedures (including critical care time)  Results for orders placed or performed during the hospital encounter of 10/21/14  CBC  Result Value Ref Range   WBC 6.3 4.0 - 10.5 K/uL   RBC 4.90 3.87 - 5.11 MIL/uL   Hemoglobin 15.3 (H) 12.0 - 15.0 g/dL   HCT 41.6 36.0 - 46.0 %   MCV 84.9 78.0 - 100.0 fL   MCH 31.2 26.0 - 34.0 pg   MCHC 36.8 (H) 30.0 - 36.0 g/dL   RDW 11.7 11.5 - 15.5 %   Platelets 188 150 - 400 K/uL  Basic metabolic panel  Result Value Ref Range   Sodium  132 (L) 135 - 145 mmol/L   Potassium 4.0 3.5 - 5.1 mmol/L   Chloride 93 (L) 96 - 112 mEq/L   CO2 30 19 - 32 mmol/L   Glucose, Bld 550 (H) 70 - 99 mg/dL   BUN 10 6 - 23 mg/dL   Creatinine, Ser 0.59 0.50 - 1.10 mg/dL   Calcium 9.4 8.4 - 10.5 mg/dL   GFR calc non Af Amer >90 >90 mL/min   GFR calc Af Amer >90 >90 mL/min   Anion gap 9 5 - 15  Troponin I  Result Value Ref Range   Troponin I 0.06 (H) <0.031 ng/mL  CBG monitoring, ED  Result Value Ref Range   Glucose-Capillary 360 (H) 70 - 99 mg/dL    Labs Review Labs Reviewed  CBC - Abnormal; Notable for the following:    Hemoglobin 15.3 (*)    MCHC 36.8 (*)    All other components within normal limits  BASIC METABOLIC PANEL - Abnormal; Notable for the following:    Sodium 132 (*)    Chloride 93 (*)    Glucose, Bld 550 (*)    All other components within normal limits  TROPONIN I - Abnormal; Notable for the  following:    Troponin I 0.06 (*)    All other components within normal limits  CBG MONITORING, ED - Abnormal; Notable for the following:    Glucose-Capillary 360 (*)    All other components within normal limits  CULTURE, BLOOD (ROUTINE X 2)  CULTURE, BLOOD (ROUTINE X 2)  HEPARIN LEVEL (UNFRACTIONATED)    Imaging Review Dg Chest 2 View  10/21/2014   CLINICAL DATA:  Chest, arm and jaw pain for 2 days.  EXAM: CHEST  2 VIEW  COMPARISON:  None.  FINDINGS: Cardiac silhouette normal in size and configuration. Normal mediastinal and hilar contours.  Clear lungs.  No pleural effusion or pneumothorax.  Surgical clips noted at the right neck base.  Bony thorax is unremarkable.  IMPRESSION: No active cardiopulmonary disease.   Electronically Signed   By: Lajean Manes M.D.   On: 10/21/2014 22:14   Ct Head Wo Contrast  10/21/2014   CLINICAL DATA:  Headache.  EXAM: CT HEAD WITHOUT CONTRAST  TECHNIQUE: Contiguous axial images were obtained from the base of the skull through the vertex without intravenous contrast.  COMPARISON:  None.  FINDINGS: Ventricles are normal in size and configuration. No parenchymal masses or mass effect. No evidence of an infarct. No extra-axial masses or abnormal fluid collections.  No intracranial hemorrhage.  Visualized sinuses and mastoid air cells are clear. No skull lesion.  IMPRESSION: Normal unenhanced CT scan the brain.   Electronically Signed   By: Lajean Manes M.D.   On: 10/21/2014 23:41   Ct Angio Chest Aorta W/cm &/or Wo/cm  10/21/2014   CLINICAL DATA:  Chest pain  EXAM: CT ANGIOGRAPHY CHEST, ABDOMEN AND PELVIS  TECHNIQUE: Multidetector CT imaging through the chest, abdomen and pelvis was performed using the standard protocol during bolus administration of intravenous contrast. Multiplanar reconstructed images and MIPs were obtained and reviewed to evaluate the vascular anatomy.  CONTRAST:  164m OMNIPAQUE IOHEXOL 350 MG/ML SOLN  COMPARISON:  None.  FINDINGS: CTA CHEST  FINDINGS  THORACIC INLET/BODY WALL:  Thyroidectomy and lower right neck dissection. Indeterminate tissue medial to the left common carotid artery, without definitive adenopathy. Given pulmonary findings, the will likely be updated dedicated staging.  MEDIASTINUM:  Normal heart size. No pericardial effusion. No acute vascular abnormality, including  central pulmonary embolism or aortic dissection. No aortic aneurysm or intramural hematoma. No adenopathy.  LUNG WINDOWS:  There are innumerable rounded pulmonary nodules throughout bilateral lungs. Most of the nodules are less than 1cm, with the largest in the left lower lobe on image 34 measuring 12 mm. In the bilateral apical upper lobes, there is also elongated ill-defined opacities with a small area of cavitation on the right. This is presumably pneumonia; no chart indication of neck radiation.  OSSEOUS:  No acute fracture.  No suspicious lytic or blastic lesions.  Review of the MIP images confirms the above findings.  CTA ABDOMEN AND PELVIS FINDINGS  BODY WALL: Unremarkable.  Liver: No focal abnormality.  Biliary: No evidence of biliary obstruction or stone.  Pancreas: Unremarkable.  Spleen: Unremarkable.  Adrenals: Unremarkable.  Kidneys and ureters: No hydronephrosis or stone.  Bladder: Unremarkable.  Reproductive: Unremarkable.  Bowel: No obstruction. No inflammatory bowel wall thickening.  Retroperitoneum: Prominent lymph nodes along the greater curvature of the stomach and in the gastrohepatic ligament, measuring approximately 1 cm in short axis. No inflammatory or neoplastic appearing gastric wall thickening.  Peritoneum: No ascites or pneumoperitoneum.  Vascular: Standard aortic branching. No significant atherosclerotic change. No aneurysm, dissection, or stenosis.  OSSEOUS: No acute abnormalities.  Review of the MIP images confirms the above findings.  IMPRESSION: 1. Negative aorta. 2. Biapical airspace disease with early cavitation, presumably pneumonia. 3.  Innumerable pulmonary nodules, likely metastases from the patient's treated thyroid cancer. 4. Mild perigastric adenopathy. Consider surveillance imaging or endoscopy.   Electronically Signed   By: Jorje Guild M.D.   On: 10/21/2014 23:53   Ct Angio Abd/pel W/ And/or W/o  10/21/2014   CLINICAL DATA:  Chest pain  EXAM: CT ANGIOGRAPHY CHEST, ABDOMEN AND PELVIS  TECHNIQUE: Multidetector CT imaging through the chest, abdomen and pelvis was performed using the standard protocol during bolus administration of intravenous contrast. Multiplanar reconstructed images and MIPs were obtained and reviewed to evaluate the vascular anatomy.  CONTRAST:  144m OMNIPAQUE IOHEXOL 350 MG/ML SOLN  COMPARISON:  None.  FINDINGS: CTA CHEST FINDINGS  THORACIC INLET/BODY WALL:  Thyroidectomy and lower right neck dissection. Indeterminate tissue medial to the left common carotid artery, without definitive adenopathy. Given pulmonary findings, the will likely be updated dedicated staging.  MEDIASTINUM:  Normal heart size. No pericardial effusion. No acute vascular abnormality, including central pulmonary embolism or aortic dissection. No aortic aneurysm or intramural hematoma. No adenopathy.  LUNG WINDOWS:  There are innumerable rounded pulmonary nodules throughout bilateral lungs. Most of the nodules are less than 1cm, with the largest in the left lower lobe on image 34 measuring 12 mm. In the bilateral apical upper lobes, there is also elongated ill-defined opacities with a small area of cavitation on the right. This is presumably pneumonia; no chart indication of neck radiation.  OSSEOUS:  No acute fracture.  No suspicious lytic or blastic lesions.  Review of the MIP images confirms the above findings.  CTA ABDOMEN AND PELVIS FINDINGS  BODY WALL: Unremarkable.  Liver: No focal abnormality.  Biliary: No evidence of biliary obstruction or stone.  Pancreas: Unremarkable.  Spleen: Unremarkable.  Adrenals: Unremarkable.  Kidneys and  ureters: No hydronephrosis or stone.  Bladder: Unremarkable.  Reproductive: Unremarkable.  Bowel: No obstruction. No inflammatory bowel wall thickening.  Retroperitoneum: Prominent lymph nodes along the greater curvature of the stomach and in the gastrohepatic ligament, measuring approximately 1 cm in short axis. No inflammatory or neoplastic appearing gastric wall thickening.  Peritoneum: No  ascites or pneumoperitoneum.  Vascular: Standard aortic branching. No significant atherosclerotic change. No aneurysm, dissection, or stenosis.  OSSEOUS: No acute abnormalities.  Review of the MIP images confirms the above findings.  IMPRESSION: 1. Negative aorta. 2. Biapical airspace disease with early cavitation, presumably pneumonia. 3. Innumerable pulmonary nodules, likely metastases from the patient's treated thyroid cancer. 4. Mild perigastric adenopathy. Consider surveillance imaging or endoscopy.   Electronically Signed   By: Jorje Guild M.D.   On: 10/21/2014 23:53      EKG Interpretation None      11:49 PM This provider spoke with Dr. Elias Else, Cardiologist - discussed case, labs, imaging, vitals in great detail. Reported that will see patient. Reported that this could be a possible PE.   12:16 AM Spoke with Dr. Elias Else who reviewed the imaging of the patient. Reported that he will come and assess the patient. Reported that patient does not appear to have a PE, or saddle PE. Recommended admission to Medicine. Cardiology to come and assess. Recommended Heparin to be started.   12:20 AM This provider spoke with dr. Dillon Bjork, [facility]. Discussed case, labs, imaging, vitals in great detail. Discussed with physician recommended from Cardiology. Patient to be admitted to [facility]. Physician to come and assess patient and stated that he will put in the antibiotics.   MDM   Final diagnoses:  Chest pain, unspecified chest pain type  Elevated troponin  Community acquired pneumonia     Medications  heparin bolus via infusion 3,000 Units (not administered)  heparin ADULT infusion 100 units/mL (25000 units/250 mL) (not administered)  sodium chloride 0.9 % bolus 1,000 mL (1,000 mLs Intravenous New Bag/Given 10/21/14 2257)  iohexol (OMNIPAQUE) 350 MG/ML injection 100 mL (100 mLs Intravenous Contrast Given 10/21/14 2313)  aspirin tablet 325 mg (325 mg Oral Given 10/21/14 2334)    Filed Vitals:   10/21/14 2215 10/21/14 2300 10/21/14 2336 10/21/14 2337  BP: 157/86 168/83 193/98 200/94  Pulse: 71 70    Temp:      TempSrc:      Resp: 17 17    Weight:      SpO2: 99% 98%     EKG noted ST segment depressions in the inferior leads with T-wave inversion in V3, heart rate 76 bpm. Troponin noted to be mildly elevated at 0.06. CBC negative elevated leukocytosis. Hemoglobin 15.3. BMP noted hyponatremia 132. Low chloride of 93. Glucose elevated at 550, negative elevated anion gap 9.0 mEq/L. Chest x-ray unremarkable. CT head unremarkable. CT angiogram of chest and abdomen negative for aortic dissection. No pericardial effusion. No acute vascular abnormalities, including central pulmonary embolism or aortic dissection. Biapical airspace disease with early cavitation-presumably pneumonia. Innumerable pulmonary nodules likely metastases from the patient's treated thyroid cancer. CT head negative for acute intracranial abnormalities. Negative findings of aortic dissection. Negative finding of saddle PE.  Beginnings of biapical airspace disease with early cavitation seen on CT - concerning. Elevated troponin identified at 0.06 with ST segment depression in inferior leads and T-wave inversion of V3. Cardiology consulted and recommended heparin to be started-heparin per pharmacy consult ordered - cardiology to come and assess patient. Patient to be admitted to stepdown secondary to being heparinized as well as early cavitation of biapical airspace disease. Blood cultures obtained. Patient stable,  afebrile. Patient stable for transfer.   Jamse Mead, PA-C 10/22/14 0033  Jamse Mead, PA-C 10/22/14 3149  Charlesetta Shanks, MD 10/23/14 [phone]

## 2014-10-21 NOTE — ED Notes (Signed)
Repeat EKG done for increased troponin

## 2014-10-21 NOTE — ED Notes (Signed)
The npt has had lt upper chest pain all day with some lt arm radiation.  The pain is worse with movement.  No cough

## 2014-10-22 ENCOUNTER — Encounter (HOSPITAL_COMMUNITY): Payer: Self-pay | Admitting: Family Medicine

## 2014-10-22 DIAGNOSIS — E119 Type 2 diabetes mellitus without complications: Secondary | ICD-10-CM

## 2014-10-22 DIAGNOSIS — R079 Chest pain, unspecified: Secondary | ICD-10-CM | POA: Diagnosis not present

## 2014-10-22 DIAGNOSIS — J189 Pneumonia, unspecified organism: Secondary | ICD-10-CM

## 2014-10-22 DIAGNOSIS — C7800 Secondary malignant neoplasm of unspecified lung: Secondary | ICD-10-CM | POA: Diagnosis present

## 2014-10-22 DIAGNOSIS — I10 Essential (primary) hypertension: Secondary | ICD-10-CM | POA: Diagnosis present

## 2014-10-22 DIAGNOSIS — R7989 Other specified abnormal findings of blood chemistry: Secondary | ICD-10-CM | POA: Insufficient documentation

## 2014-10-22 DIAGNOSIS — Z9221 Personal history of antineoplastic chemotherapy: Secondary | ICD-10-CM | POA: Diagnosis not present

## 2014-10-22 DIAGNOSIS — Z85850 Personal history of malignant neoplasm of thyroid: Secondary | ICD-10-CM | POA: Diagnosis not present

## 2014-10-22 DIAGNOSIS — E1165 Type 2 diabetes mellitus with hyperglycemia: Secondary | ICD-10-CM | POA: Diagnosis present

## 2014-10-22 DIAGNOSIS — Z79899 Other long term (current) drug therapy: Secondary | ICD-10-CM | POA: Diagnosis not present

## 2014-10-22 DIAGNOSIS — C799 Secondary malignant neoplasm of unspecified site: Secondary | ICD-10-CM

## 2014-10-22 DIAGNOSIS — R778 Other specified abnormalities of plasma proteins: Secondary | ICD-10-CM | POA: Insufficient documentation

## 2014-10-22 DIAGNOSIS — I214 Non-ST elevation (NSTEMI) myocardial infarction: Secondary | ICD-10-CM | POA: Diagnosis not present

## 2014-10-22 DIAGNOSIS — C73 Malignant neoplasm of thyroid gland: Secondary | ICD-10-CM

## 2014-10-22 LAB — HEPARIN LEVEL (UNFRACTIONATED)
Heparin Unfractionated: 0.31 IU/mL (ref 0.30–0.70)
Heparin Unfractionated: 0.27 IU/mL — ABNORMAL LOW (ref 0.30–0.70)

## 2014-10-22 LAB — PROCALCITONIN

## 2014-10-22 LAB — GLUCOSE, CAPILLARY
GLUCOSE-CAPILLARY: 287 mg/dL — AB (ref 70–99)
GLUCOSE-CAPILLARY: 96 mg/dL (ref 70–99)
GLUCOSE-CAPILLARY: 309 mg/dL — AB (ref 70–99)
GLUCOSE-CAPILLARY: 163 mg/dL — AB (ref 70–99)
Glucose-Capillary: 300 mg/dL — ABNORMAL HIGH (ref 70–99)

## 2014-10-22 LAB — COMPREHENSIVE METABOLIC PANEL
ALT: 24 U/L (ref 0–35)
ANION GAP: 9 (ref 5–15)
AST: 23 U/L (ref 0–37)
Albumin: 3.7 g/dL (ref 3.5–5.2)
Alkaline Phosphatase: 133 U/L — ABNORMAL HIGH (ref 39–117)
BUN: 5 mg/dL — ABNORMAL LOW (ref 6–23)
CALCIUM: 8.8 mg/dL (ref 8.4–10.5)
CHLORIDE: 102 meq/L (ref 96–112)
CO2: 27 mmol/L (ref 19–32)
CREATININE: 0.4 mg/dL — AB (ref 0.50–1.10)
GFR calc Af Amer: >90 mL/min (ref >90)
GFR calc non Af Amer: >90 mL/min (ref >90)
GLUCOSE: 326 mg/dL — AB (ref 70–99)
Potassium: 3.1 mmol/L — ABNORMAL LOW (ref 3.5–5.1)
SODIUM: 138 mmol/L (ref 135–145)
TOTAL PROTEIN: 6.6 g/dL (ref 6.0–8.3)
Total Bilirubin: 0.7 mg/dL (ref 0.3–1.2)

## 2014-10-22 LAB — CBC
HCT: 38.5 % (ref 36.0–46.0)
HEMOGLOBIN: 13.8 g/dL (ref 12.0–15.0)
MCH: 30.2 pg (ref 26.0–34.0)
MCHC: 35.8 g/dL (ref 30.0–36.0)
MCV: 84.2 fL (ref 78.0–100.0)
Platelets: 182 10*3/uL (ref 150–400)
RBC: 4.57 MIL/uL (ref 3.87–5.11)
RDW: 11.7 % (ref 11.5–15.5)
WBC: 6.7 10*3/uL (ref 4.0–10.5)

## 2014-10-22 LAB — TROPONIN I
TROPONIN I: 0.06 ng/mL — AB (ref <0.031)
TROPONIN I: 0.04 ng/mL — AB (ref <0.031)
Troponin I: 0.1 ng/mL — ABNORMAL HIGH (ref <0.031)

## 2014-10-22 LAB — HEMOGLOBIN A1C
HEMOGLOBIN A1C: 12.9 % — AB (ref <5.7)
MEAN PLASMA GLUCOSE: 324 mg/dL — AB (ref <117)

## 2014-10-22 LAB — STREP PNEUMONIAE URINARY ANTIGEN: Strep Pneumo Urinary Antigen: NEGATIVE

## 2014-10-22 LAB — MRSA PCR SCREENING: MRSA by PCR: NEGATIVE

## 2014-10-22 MED ORDER — GI COCKTAIL ~~LOC~~
30.0000 mL | Freq: Once | ORAL | Status: AC
  Administered 2014-10-22: 30 mL via ORAL
  Filled 2014-10-22: qty 30

## 2014-10-22 MED ORDER — IRBESARTAN 150 MG PO TABS
150.0000 mg | ORAL_TABLET | Freq: Every day | ORAL | Status: DC
  Administered 2014-10-22 – 2014-10-24 (×3): 150 mg via ORAL
  Filled 2014-10-22 (×3): qty 1

## 2014-10-22 MED ORDER — SENNA 8.6 MG PO TABS
1.0000 | ORAL_TABLET | Freq: Two times a day (BID) | ORAL | Status: DC
  Administered 2014-10-22 – 2014-10-24 (×5): 8.6 mg via ORAL
  Filled 2014-10-22 (×6): qty 1

## 2014-10-22 MED ORDER — VANCOMYCIN HCL IN DEXTROSE 1-5 GM/200ML-% IV SOLN
1000.0000 mg | Freq: Two times a day (BID) | INTRAVENOUS | Status: DC
Start: 2014-10-22 — End: 2014-10-23
  Administered 2014-10-22 – 2014-10-23 (×3): 1000 mg via INTRAVENOUS
  Filled 2014-10-22 (×4): qty 200

## 2014-10-22 MED ORDER — SODIUM CHLORIDE 0.9 % IJ SOLN
3.0000 mL | Freq: Two times a day (BID) | INTRAMUSCULAR | Status: DC
  Administered 2014-10-22 – 2014-10-23 (×2): 3 mL via INTRAVENOUS

## 2014-10-22 MED ORDER — HEPARIN (PORCINE) IN NACL 100-0.45 UNIT/ML-% IJ SOLN
1100.0000 [IU]/h | INTRAMUSCULAR | Status: DC
  Administered 2014-10-22: 900 [IU]/h via INTRAVENOUS
  Administered 2014-10-23: 1100 [IU]/h via INTRAVENOUS
  Filled 2014-10-22 (×3): qty 250

## 2014-10-22 MED ORDER — ATORVASTATIN CALCIUM 40 MG PO TABS
40.0000 mg | ORAL_TABLET | Freq: Every day | ORAL | Status: DC
  Administered 2014-10-22 – 2014-10-23 (×2): 40 mg via ORAL
  Filled 2014-10-22 (×3): qty 1

## 2014-10-22 MED ORDER — POTASSIUM CHLORIDE CRYS ER 20 MEQ PO TBCR
40.0000 meq | EXTENDED_RELEASE_TABLET | Freq: Once | ORAL | Status: AC
  Administered 2014-10-22: 40 meq via ORAL
  Filled 2014-10-22: qty 2

## 2014-10-22 MED ORDER — ACETAMINOPHEN 325 MG PO TABS
650.0000 mg | ORAL_TABLET | Freq: Four times a day (QID) | ORAL | Status: DC | PRN
  Administered 2014-10-22 – 2014-10-23 (×2): 650 mg via ORAL
  Filled 2014-10-22 (×2): qty 2

## 2014-10-22 MED ORDER — ACETAMINOPHEN 650 MG RE SUPP: 650.0000 mg | Freq: Four times a day (QID) | RECTAL | Status: DC | PRN

## 2014-10-22 MED ORDER — ONDANSETRON HCL 4 MG PO TABS: 4.0000 mg | ORAL_TABLET | Freq: Four times a day (QID) | ORAL | Status: DC | PRN

## 2014-10-22 MED ORDER — LEVOTHYROXINE SODIUM 200 MCG PO TABS
200.0000 ug | ORAL_TABLET | Freq: Every day | ORAL | Status: DC
  Administered 2014-10-22 – 2014-10-24 (×3): 200 ug via ORAL
  Filled 2014-10-22 (×4): qty 1

## 2014-10-22 MED ORDER — INSULIN ASPART 100 UNIT/ML ~~LOC~~ SOLN
0.0000 [IU] | Freq: Three times a day (TID) | SUBCUTANEOUS | Status: DC
  Administered 2014-10-22: 5 [IU] via SUBCUTANEOUS
  Administered 2014-10-22: 7 [IU] via SUBCUTANEOUS
  Administered 2014-10-23: 3 [IU] via SUBCUTANEOUS

## 2014-10-22 MED ORDER — ONDANSETRON HCL 4 MG/2ML IJ SOLN: 4.0000 mg | Freq: Four times a day (QID) | INTRAMUSCULAR | Status: DC | PRN

## 2014-10-22 MED ORDER — HEPARIN BOLUS VIA INFUSION
3000.0000 [IU] | Freq: Once | INTRAVENOUS | Status: AC
  Administered 2014-10-22: 3000 [IU] via INTRAVENOUS
  Filled 2014-10-22: qty 3000

## 2014-10-22 MED ORDER — MORPHINE SULFATE 4 MG/ML IJ SOLN
4.0000 mg | INTRAMUSCULAR | Status: DC | PRN
  Administered 2014-10-22: 4 mg via INTRAVENOUS
  Filled 2014-10-22: qty 1

## 2014-10-22 MED ORDER — POLYETHYLENE GLYCOL 3350 17 G PO PACK
17.0000 g | PACK | Freq: Every day | ORAL | Status: DC | PRN
  Filled 2014-10-22: qty 1

## 2014-10-22 MED ORDER — ASPIRIN 81 MG PO CHEW
81.0000 mg | CHEWABLE_TABLET | Freq: Every day | ORAL | Status: DC
  Administered 2014-10-22 – 2014-10-24 (×2): 81 mg via ORAL
  Filled 2014-10-22 (×2): qty 1

## 2014-10-22 MED ORDER — SODIUM CHLORIDE 0.9 % IV SOLN
INTRAVENOUS | Status: DC
  Administered 2014-10-22 – 2014-10-23 (×2): via INTRAVENOUS

## 2014-10-22 MED ORDER — DEXTROSE 5 % IV SOLN
1.0000 g | Freq: Three times a day (TID) | INTRAVENOUS | Status: DC
  Administered 2014-10-22 – 2014-10-23 (×4): 1 g via INTRAVENOUS
  Filled 2014-10-22 (×7): qty 1

## 2014-10-22 MED ORDER — METOPROLOL TARTRATE 12.5 MG HALF TABLET
12.5000 mg | ORAL_TABLET | Freq: Two times a day (BID) | ORAL | Status: DC
  Administered 2014-10-22 – 2014-10-24 (×6): 12.5 mg via ORAL
  Filled 2014-10-22 (×7): qty 1

## 2014-10-22 NOTE — Care Management Note (Addendum)
    Page 1 of 1   10/23/2014     9:59:04 AM CARE MANAGEMENT NOTE 10/23/2014  Patient:  Jenny [name], Jenny [name]   Account Number:  [id number]  Date Initiated:  10/22/2014  Documentation initiated by:  Elissa Hefty  Subjective/Objective Assessment:   adm w pneumonia     Action/Plan:   lives at home, pcp dr deepak Annitta Needs   Anticipated DC Date:     Anticipated DC Plan:  [facility]      Choice offered to / List presented to:             Status of service:   Medicare Important Message given?   (If response is "NO", the following Medicare IM given date fields will be blank) Date Medicare IM given:   Medicare IM given by:   Date Additional Medicare IM given:   Additional Medicare IM given by:    Discharge Disposition:    Per UR Regulation:  Reviewed for med. necessity/level of care/duration of stay  If discussed at [place] of Stay Meetings, dates discussed:    Comments:  1/20 [place] rn ,bsn gave pt on [facility]. no ins listed. pt states no ins.

## 2014-10-22 NOTE — Progress Notes (Signed)
Pt has red welps on her right anterior forearm. According to the pt she already had it prior to admission.

## 2014-10-22 NOTE — H&P (Signed)
[facility] History and Physical  [name] JJK:[id number] DOB: 1965/11/14 DOA: 10/21/2014  Referring physician: PA [place] PCP: Jenny Marek, MD   Chief Complaint: CP  HPI: Jenny [name] is a 49 y.o. female   PT ENCOUNTER AIDED BY SPANISH PHONE [place]   CP. Started today around 19:30. Sudden onset. Associated w/ HA, SOB, and palpitations. Radiation to neck and L arm and back. Worse w/ deep breathing. Pain is getting worse.  Denies fevers, n/v/,dysuria, frequency. H/o Thyroid cancer and is followed by [facility] Pt states she has metastatic disease to lungs and stomach armpits and throat. Last chemo and radiation 18 mo ago in [place]  Review of Systems:  Constitutional:  No weight loss, night sweats, Fevers, chills, fatigue.  HEENT:  No  Difficulty swallowing,Tooth/dental problems,Sore throat,  No sneezing, itching, ear ache, nasal congestion, post nasal drip,  Cardio-vascular: Per HPI GI:  No heartburn, indigestion, abdominal pain, nausea, vomiting, diarrhea, change in bowel habits, loss of appetite  Resp: Per HPI Skin:  no rash or lesions.  GU:  no dysuria, change in color of urine, no urgency or frequency. No flank pain.  Musculoskeletal:   No joint pain or swelling. No decreased range of motion. No back pain.  Psych:  No change in mood or affect. No depression or anxiety. No memory loss.   Past Medical History  Diagnosis Date  . Hypertension   . Thyroid cancer   . Diabetes mellitus without complication   . Metastatic disease    Past Surgical History  Procedure Laterality Date  . Thyroidectomy    . Cesarean section     Social History:  reports that she has never smoked. She does not have any smokeless tobacco history on file. She reports that she does not drink alcohol. Her drug history is not on file.  No Known Allergies  Family History  Problem Relation Age of Onset  . Diabetes Mother   . Hypertension Mother   . Diabetes Sister   . Cancer  Sister      Prior to Admission medications   Medication Sig Start Date End Date Taking? Authorizing Provider  cyclobenzaprine (FLEXERIL) 10 MG tablet Take 1 tablet (10 mg total) by mouth 3 (three) times daily as needed for muscle spasms. 05/03/14   Jenny Marek, MD  fluconazole (DIFLUCAN) 150 MG tablet Take 1 tablet (150 mg total) by mouth daily. For one dose 01/16/14   Audelia Hives Presson, PA  glucose monitoring kit (FREESTYLE) monitoring kit 1 each by Does not apply route 4 (four) times daily - after meals and at bedtime. 1 month Diabetic Testing Supplies for QAC-QHS accuchecks. 05/03/14   Jenny Marek, MD  levothyroxine (SYNTHROID, LEVOTHROID) 200 MCG tablet Take 1 tablet (200 mcg total) by mouth daily before breakfast. 05/03/14   Jenny Marek, MD  metFORMIN (GLUCOPHAGE) 1000 MG tablet Take 1 tablet (1,000 mg total) by mouth 2 (two) times daily with a meal. 05/03/14   Jenny Marek, MD  metroNIDAZOLE (FLAGYL) 500 MG tablet Take 1 tablet (500 mg total) by mouth 2 (two) times daily. X 7 days 01/16/14   Audelia Hives Presson, PA  olmesartan (BENICAR) 20 MG tablet Take 1 tablet (20 mg total) by mouth daily. 05/03/14   Jenny Marek, MD  thyroid (ARMOUR) 30 MG tablet Take 30 mg by mouth daily before breakfast.    Historical Provider, MD   Physical Exam: Filed Vitals:   10/22/14 0000 10/22/14 0015 10/22/14 0045 10/22/14 0100  BP: 156/89  147/86 152/85 142/79  Pulse: 74 63 68 72  Temp:      TempSrc:      Resp: '15 17 21 16  ' Weight:      SpO2: 99% 99% 98% 99%    Wt Readings from Last 3 Encounters:  10/21/14 63.504 kg (140 lb)  05/03/14 63.685 kg (140 lb 6.4 oz)    General: appears to be in pain Eyes: , normal lids, irises & conjunctiva ENT:  grossly normal hearing, lips & tongue Neck: surgical scar present and intact,, masses  Cardiovascular:  RRR, II/VI systolic murmur. No LE edema. Telemetry:  SR, no arrhythmias  Respiratory:  CTA bilaterally, no w/r/r. Normal respiratory  effort. Abdomen:  soft, ntnd Skin:  no rash or induration seen on limited exam Musculoskeletal:  grossly normal tone BUE/BLE Psychiatric:  grossly normal mood and affect, speech fluent and appropriate Neurologic:  grossly non-focal.          Labs on Admission:  Basic Metabolic Panel:  Recent Labs Lab 10/21/14 2128  NA 132*  K 4.0  CL 93*  CO2 30  GLUCOSE 550*  BUN 10  CREATININE 0.59  CALCIUM 9.4   Liver Function Tests: No results for input(s): AST, ALT, ALKPHOS, BILITOT, PROT, ALBUMIN in the last 168 hours. No results for input(s): LIPASE, AMYLASE in the last 168 hours. No results for input(s): AMMONIA in the last 168 hours. CBC:  Recent Labs Lab 10/21/14 2128  WBC 6.3  HGB 15.3*  HCT 41.6  MCV 84.9  PLT 188   Cardiac Enzymes:  Recent Labs Lab 10/21/14 2128  TROPONINI 0.06*    BNP (last 3 results) No results for input(s): PROBNP in the last 8760 hours. CBG:  Recent Labs Lab 10/21/14 2344  GLUCAP 360*    Radiological Exams on Admission: Dg Chest 2 View  10/21/2014   CLINICAL DATA:  Chest, arm and jaw pain for 2 days.  EXAM: CHEST  2 VIEW  COMPARISON:  None.  FINDINGS: Cardiac silhouette normal in size and configuration. Normal mediastinal and hilar contours.  Clear lungs.  No pleural effusion or pneumothorax.  Surgical clips noted at the right neck base.  Bony thorax is unremarkable.  IMPRESSION: No active cardiopulmonary disease.   Electronically Signed   By: Lajean Manes M.D.   On: 10/21/2014 22:14   Ct Head Wo Contrast  10/21/2014   CLINICAL DATA:  Headache.  EXAM: CT HEAD WITHOUT CONTRAST  TECHNIQUE: Contiguous axial images were obtained from the base of the skull through the vertex without intravenous contrast.  COMPARISON:  None.  FINDINGS: Ventricles are normal in size and configuration. No parenchymal masses or mass effect. No evidence of an infarct. No extra-axial masses or abnormal fluid collections.  No intracranial hemorrhage.  Visualized  sinuses and mastoid air cells are clear. No skull lesion.  IMPRESSION: Normal unenhanced CT scan the brain.   Electronically Signed   By: Lajean Manes M.D.   On: 10/21/2014 23:41   Ct Angio Chest Aorta W/cm &/or Wo/cm  10/21/2014   CLINICAL DATA:  Chest pain  EXAM: CT ANGIOGRAPHY CHEST, ABDOMEN AND PELVIS  TECHNIQUE: Multidetector CT imaging through the chest, abdomen and pelvis was performed using the standard protocol during bolus administration of intravenous contrast. Multiplanar reconstructed images and MIPs were obtained and reviewed to evaluate the vascular anatomy.  CONTRAST:  139m OMNIPAQUE IOHEXOL 350 MG/ML SOLN  COMPARISON:  None.  FINDINGS: CTA CHEST FINDINGS  THORACIC INLET/BODY WALL:  Thyroidectomy and lower right neck  dissection. Indeterminate tissue medial to the left common carotid artery, without definitive adenopathy. Given pulmonary findings, the will likely be updated dedicated staging.  MEDIASTINUM:  Normal heart size. No pericardial effusion. No acute vascular abnormality, including central pulmonary embolism or aortic dissection. No aortic aneurysm or intramural hematoma. No adenopathy.  LUNG WINDOWS:  There are innumerable rounded pulmonary nodules throughout bilateral lungs. Most of the nodules are less than 1cm, with the largest in the left lower lobe on image 34 measuring 12 mm. In the bilateral apical upper lobes, there is also elongated ill-defined opacities with a small area of cavitation on the right. This is presumably pneumonia; no chart indication of neck radiation.  OSSEOUS:  No acute fracture.  No suspicious lytic or blastic lesions.  Review of the MIP images confirms the above findings.  CTA ABDOMEN AND PELVIS FINDINGS  BODY WALL: Unremarkable.  Liver: No focal abnormality.  Biliary: No evidence of biliary obstruction or stone.  Pancreas: Unremarkable.  Spleen: Unremarkable.  Adrenals: Unremarkable.  Kidneys and ureters: No hydronephrosis or stone.  Bladder: Unremarkable.   Reproductive: Unremarkable.  Bowel: No obstruction. No inflammatory bowel wall thickening.  Retroperitoneum: Prominent lymph nodes along the greater curvature of the stomach and in the gastrohepatic ligament, measuring approximately 1 cm in short axis. No inflammatory or neoplastic appearing gastric wall thickening.  Peritoneum: No ascites or pneumoperitoneum.  Vascular: Standard aortic branching. No significant atherosclerotic change. No aneurysm, dissection, or stenosis.  OSSEOUS: No acute abnormalities.  Review of the MIP images confirms the above findings.  IMPRESSION: 1. Negative aorta. 2. Biapical airspace disease with early cavitation, presumably pneumonia. 3. Innumerable pulmonary nodules, likely metastases from the patient's treated thyroid cancer. 4. Mild perigastric adenopathy. Consider surveillance imaging or endoscopy.   Electronically Signed   By: Jorje Guild M.D.   On: 10/21/2014 23:53   Ct Angio Abd/pel W/ And/or W/o  10/21/2014   CLINICAL DATA:  Chest pain  EXAM: CT ANGIOGRAPHY CHEST, ABDOMEN AND PELVIS  TECHNIQUE: Multidetector CT imaging through the chest, abdomen and pelvis was performed using the standard protocol during bolus administration of intravenous contrast. Multiplanar reconstructed images and MIPs were obtained and reviewed to evaluate the vascular anatomy.  CONTRAST:  127m OMNIPAQUE IOHEXOL 350 MG/ML SOLN  COMPARISON:  None.  FINDINGS: CTA CHEST FINDINGS  THORACIC INLET/BODY WALL:  Thyroidectomy and lower right neck dissection. Indeterminate tissue medial to the left common carotid artery, without definitive adenopathy. Given pulmonary findings, the will likely be updated dedicated staging.  MEDIASTINUM:  Normal heart size. No pericardial effusion. No acute vascular abnormality, including central pulmonary embolism or aortic dissection. No aortic aneurysm or intramural hematoma. No adenopathy.  LUNG WINDOWS:  There are innumerable rounded pulmonary nodules throughout bilateral  lungs. Most of the nodules are less than 1cm, with the largest in the left lower lobe on image 34 measuring 12 mm. In the bilateral apical upper lobes, there is also elongated ill-defined opacities with a small area of cavitation on the right. This is presumably pneumonia; no chart indication of neck radiation.  OSSEOUS:  No acute fracture.  No suspicious lytic or blastic lesions.  Review of the MIP images confirms the above findings.  CTA ABDOMEN AND PELVIS FINDINGS  BODY WALL: Unremarkable.  Liver: No focal abnormality.  Biliary: No evidence of biliary obstruction or stone.  Pancreas: Unremarkable.  Spleen: Unremarkable.  Adrenals: Unremarkable.  Kidneys and ureters: No hydronephrosis or stone.  Bladder: Unremarkable.  Reproductive: Unremarkable.  Bowel: No obstruction. No inflammatory bowel  wall thickening.  Retroperitoneum: Prominent lymph nodes along the greater curvature of the stomach and in the gastrohepatic ligament, measuring approximately 1 cm in short axis. No inflammatory or neoplastic appearing gastric wall thickening.  Peritoneum: No ascites or pneumoperitoneum.  Vascular: Standard aortic branching. No significant atherosclerotic change. No aneurysm, dissection, or stenosis.  OSSEOUS: No acute abnormalities.  Review of the MIP images confirms the above findings.  IMPRESSION: 1. Negative aorta. 2. Biapical airspace disease with early cavitation, presumably pneumonia. 3. Innumerable pulmonary nodules, likely metastases from the patient's treated thyroid cancer. 4. Mild perigastric adenopathy. Consider surveillance imaging or endoscopy.   Electronically Signed   By: Jorje Guild M.D.   On: 10/21/2014 23:53    EKG: Independently reviewed. T-wave inversion in II, III, amnd AVF w/ slight depression in AVF. No previous EKG to compare. NSR  Assessment/Plan Principal Problem:   Chest pain Active Problems:   Essential hypertension, benign   Thyroid cancer   Bilateral pneumonia   Metastatic  disease   Type 2 diabetes mellitus without complication   Essential hypertension  Chest pain: likely cardiac. Troponin 0.06 and EKG concerning for ischemia. Cardiology consulted by ED and requesting pt be started on heparin drip. CT negtive for dissection or PE. May also be due to likely pneumonia from metastatic disease given pleuritic component. - admit to cardiac stepdown - nitro and morphine - continue heparin drip - f/u cards recommendations - gi cocktail  Bilateral apical cavitary pneumonia: likely from progression of malignancy.  - vanc and cefepime - narrow as improves - legionella and Strep Ag - sputum cx - procalcitonin - quantiferon gold  Metastatic papillary thyroid cancer: last chemo and radiation 18 mo ago. Followed by [facility]. Reviewed last clinic note - primarily surveillance. Thyroidectomy 2012. Metastatic disease reported to be in lungs (inumerable nodules), brain and neck.  - outpt f/u - +/- inpt palliative consult - synthroid  DM: last A1c 6.2 (pre-DM). Glucose on admission 550  - A1c - SSI - hold metformin  HTN: normotensive - continue home ACEi  Code Status: FULL DVT Prophylaxis: Heparin drip Family Communication: None Disposition Plan: pending improvement   MERRELL, DAVID Lenna Sciara, MD Family Medicine [facility] [email or web address] Password [email or web address]

## 2014-10-22 NOTE — Progress Notes (Signed)
Inpatient Diabetes Program Recommendations  AACE/ADA: New Consensus Statement on Inpatient Glycemic Control (2013)  Target Ranges:  Prepandial:   less than 140 mg/dL      Peak postprandial:   less than 180 mg/dL (1-2 hours)      Critically ill patients:  140 - 180 mg/dL    Inpatient Diabetes Program Recommendations Insulin - Basal: add Lantus 10 units  Change Novolog to Q4 while NPO. A1C pending. Thank you  Raoul Pitch BSN, RN,CDE Inpatient Diabetes Coordinator [phone] (team pager)

## 2014-10-22 NOTE — Progress Notes (Signed)
ANTICOAGULATION CONSULT NOTE - Follow Up Consult  Pharmacy Consult for Heparin Indication: chest pain/ACS  No Known Allergies  Patient Measurements: Height: 5\' 2"  (157.5 cm) (estimated) Weight: 140 lb (63.504 kg) IBW/kg (Calculated) : 50.1 Heparin Dosing Weight: 63.5 kg  Vital Signs: BP: 142/71 mmHg (01/19 1300) Pulse Rate: 77 (01/19 1100)  Labs:  Recent Labs  10/21/14 2128 10/22/14 0200 10/22/14 0716 10/22/14 0728 10/22/14 1315 10/22/14 1525  HGB 15.3* 13.8  --   --   --   --   HCT 41.6 38.5  --   --   --   --   PLT 188 182  --   --   --   --   HEPARINUNFRC  --   --  0.31  --   --  0.27*  CREATININE 0.59 0.40*  --   --   --   --   TROPONINI 0.06*  --   --  0.10* 0.06*  --     Estimated Creatinine Clearance: 73.7 mL/min (by C-G formula based on Cr of 0.4).  Assessment:   Heparin level this afternoon is 0.27, just below target range. Drip increased from 900 to 950 units/hr this am when heparin level was 0.31.  Cardiac cath scheduled for 10/23/14 am.  Goal of Therapy:  Heparin level 0.3-0.7 units/ml Monitor platelets by anticoagulation protocol: Yes   Plan:    Increase heparin drip to 1100 units/hr.   Next heparin level and CBC in am.  Arty Baumgartner, [place] Pager: [phone] 10/22/2014,5:05 PM

## 2014-10-22 NOTE — Progress Notes (Signed)
ANTICOAGULATION CONSULT NOTE - Follow Up Consult  Pharmacy Consult for heparin Indication: chest pain/ACS  No Known Allergies  Patient Measurements: Weight: 140 lb (63.504 kg)  Vital Signs: Temp: 97.7 F (36.5 C) (01/19 0400) Temp Source: Axillary (01/19 0400) BP: 153/71 mmHg (01/19 0900) Pulse Rate: 75 (01/19 0925)  Labs:  Recent Labs  10/21/14 2128 10/22/14 0200 10/22/14 0716 10/22/14 0728  HGB 15.3* 13.8  --   --   HCT 41.6 38.5  --   --   PLT 188 182  --   --   HEPARINUNFRC  --   --  0.31  --   CREATININE 0.59 0.40*  --   --   TROPONINI 0.06*  --   --  0.10*    CrCl cannot be calculated (Unknown ideal weight.).   Medications:  Scheduled:  . aspirin  81 mg Oral Daily  . atorvastatin  40 mg Oral q1800  . ceFEPime (MAXIPIME) IV  1 g Intravenous 3 times per day  . insulin aspart  0-9 Units Subcutaneous TID WC  . irbesartan  150 mg Oral Daily  . levothyroxine  200 mcg Oral QAC breakfast  . metoprolol tartrate  12.5 mg Oral BID  . senna  1 tablet Oral BID  . sodium chloride  3 mL Intravenous Q12H  . vancomycin  1,000 mg Intravenous Q12H    Assessment: 49 yo female here with CP and NSTEMI on heparin and initial heparin level is at the low end of goal (HL= 0.31). Patient noted for possible cath.  Goal of Therapy:  Heparin level 0.3-0.7 units/ml Monitor platelets by anticoagulation protocol: Yes   Plan:  -Increase heparin to 950 units/hr to keep in goal -Heparin level later today to confirm patient is at goal -Daily heparin level and CBC  Hildred Laser, Pharm D 10/22/2014 9:34 AM

## 2014-10-22 NOTE — Progress Notes (Addendum)
ANTICOAGULATION CONSULT NOTE - Initial Consult  Pharmacy Consult for heparin Indication: chest pain/ACS  No Known Allergies  Patient Measurements: Weight: 140 lb (63.504 kg) Heparin Dosing Weight:   Vital Signs: Temp: 97.7 F (36.5 C) (01/18 2122) Temp Source: Oral (01/18 2122) BP: 200/94 mmHg (01/18 2337) Pulse Rate: 70 (01/18 2300)  Labs:  Recent Labs  10/21/14 2128  HGB 15.3*  HCT 41.6  PLT 188  CREATININE 0.59  TROPONINI 0.06*    CrCl cannot be calculated (Unknown ideal weight.).   Medical History: Past Medical History  Diagnosis Date  . Hypertension   . Cancer   . Diabetes mellitus without complication     Medications:   (Not in a hospital admission)  Assessment: 49 yo diabetic female with hx htn and thyroid cancer with mets to lungs and stomach ([place]  presenting with CP with radiation to arm and now elevated troponins. No oral anticoagulants pta.  Goal of Therapy:  Heparin level 0.3-0.7 units/ml Monitor platelets by anticoagulation protocol: Yes   Plan:  Heparin 3000 unitsx1 then 900 units/hr. 6 hours HL  Daily HL and cbc starting on 1/20 with am labs.   Curlene Dolphin 10/22/2014,12:25 AM    1:13 AM Add vancomycin For r/o pna  Give vancocyin 1gm q12h  Trough before 4th dose.   Curlene Dolphin

## 2014-10-22 NOTE — Consult Note (Signed)
CARDIOLOGY CONSULT NOTE  Patient ID: Jenny [name], MRN: [id number], DOB/AGE: 1966/01/27 49 y.o. Admit date: 10/21/2014 Date of Consult: 10/22/2014  Primary Physician: Lorayne Marek, MD Primary Cardiologist: unassigned  Chief Complaint: chest pain Reason for Consultation: elevated troponin  HPI: 49 y.o. female w/ PMHx significant for DM2, HTN, metastatic thyroid cancer who presented to [facility] on 10/22/2014 with complaints of chest pain. Patient is Spanish speaking only and language line was utilized. She reports acute onset of left upper chest pain and "swelling". Associated with shortness of breath but no diaphoresis, nausea. Radiates to her back. Described as a squeezing sensation. Never had before. Was resting at the onset of pain, finally resolved in the ER with nitro and morphine . Has a severe headache as well that coincided with the chest pain. Denies fever, chills, cough, productive cough.  Never seen a cardiologist. Was told she had a mildly enlarged heart in the past.  Underwent CT scan in the ER which was negative for dissection, large PE (and no calcium on this nongated study) but did show apical airspace dz consistent with pneumonia, multiple presumable metastasis and adenopathy. Underwent head CT which was negative for abnormality.  Currently chest pain free. Still with headache.   Past Medical History  Diagnosis Date  . Hypertension   . Thyroid cancer   . Diabetes mellitus without complication   . Metastatic disease       Surgical History:  Past Surgical History  Procedure Laterality Date  . Thyroidectomy    . Cesarean section       Home Meds: Prior to Admission medications   Medication Sig Start Date End Date Taking? Authorizing Provider  levothyroxine (SYNTHROID, LEVOTHROID) 200 MCG tablet Take 1 tablet (200 mcg total) by mouth daily before breakfast. 05/03/14  Yes Lorayne Marek, MD  metFORMIN (GLUCOPHAGE) 1000 MG tablet Take 1 tablet (1,000 mg total)  by mouth 2 (two) times daily with a meal. 05/03/14  Yes Deepak Advani, MD  olmesartan (BENICAR) 20 MG tablet Take 1 tablet (20 mg total) by mouth daily. 05/03/14  Yes Lorayne Marek, MD  cyclobenzaprine (FLEXERIL) 10 MG tablet Take 1 tablet (10 mg total) by mouth 3 (three) times daily as needed for muscle spasms. Patient not taking: Reported on 10/22/2014 05/03/14   Lorayne Marek, MD  fluconazole (DIFLUCAN) 150 MG tablet Take 1 tablet (150 mg total) by mouth daily. For one dose Patient not taking: Reported on 10/22/2014 01/16/14   Audelia Hives Presson, PA  metroNIDAZOLE (FLAGYL) 500 MG tablet Take 1 tablet (500 mg total) by mouth 2 (two) times daily. X 7 days Patient not taking: Reported on 10/22/2014 01/16/14   Lutricia Feil, PA    Inpatient Medications:  . ceFEPime (MAXIPIME) IV  1 g Intravenous 3 times per day  . insulin aspart  0-9 Units Subcutaneous TID WC  . irbesartan  150 mg Oral Daily  . levothyroxine  200 mcg Oral QAC breakfast  . senna  1 tablet Oral BID  . sodium chloride  3 mL Intravenous Q12H  . vancomycin  1,000 mg Intravenous Q12H   . sodium chloride 100 mL/hr at 10/22/14 0131  . heparin 900 Units/hr (10/22/14 0053)    Allergies: No Known Allergies  History   Social History  . Marital Status: Single    Spouse Name: N/A    Number of Children: N/A  . Years of Education: N/A   Occupational History  . Not on file.   Social History Main  Topics  . Smoking status: Never Smoker   . Smokeless tobacco: Not on file  . Alcohol Use: No  . Drug Use: Not on file  . Sexual Activity: Not on file   Other Topics Concern  . Not on file   Social History Narrative     Family History  Problem Relation Age of Onset  . Diabetes Mother   . Hypertension Mother   . Diabetes Sister   . Cancer Sister      Review of Systems: General: negative for chills, fever, night sweats or weight changes.  Cardiovascular: see HPI Dermatological: negative for rash Respiratory:  negative for cough or wheezing Urologic: negative for hematuria Abdominal: negative for nausea, vomiting, diarrhea, bright red blood per rectum, melena, or hematemesis Neurologic: negative for visual changes, syncope, or dizziness. Tender outside thighs All other systems reviewed and are otherwise negative except as noted above.  Labs:  Recent Labs  10/21/14 2128  TROPONINI 0.06*   Lab Results  Component Value Date   WBC 6.7 10/22/2014   HGB 13.8 10/22/2014   HCT 38.5 10/22/2014   MCV 84.2 10/22/2014   PLT 182 10/22/2014    Recent Labs Lab 10/21/14 2128  NA 132*  K 4.0  CL 93*  CO2 30  BUN 10  CREATININE 0.59  CALCIUM 9.4  GLUCOSE 550*   No results found for: CHOL, HDL, LDLCALC, TRIG No results found for: DDIMER  Radiology/Studies:  Dg Chest 2 View  10/21/2014   CLINICAL DATA:  Chest, arm and jaw pain for 2 days.  EXAM: CHEST  2 VIEW  COMPARISON:  None.  FINDINGS: Cardiac silhouette normal in size and configuration. Normal mediastinal and hilar contours.  Clear lungs.  No pleural effusion or pneumothorax.  Surgical clips noted at the right neck base.  Bony thorax is unremarkable.  IMPRESSION: No active cardiopulmonary disease.   Electronically Signed   By: Lajean Manes M.D.   On: 10/21/2014 22:14   Ct Head Wo Contrast  10/21/2014   CLINICAL DATA:  Headache.  EXAM: CT HEAD WITHOUT CONTRAST  TECHNIQUE: Contiguous axial images were obtained from the base of the skull through the vertex without intravenous contrast.  COMPARISON:  None.  FINDINGS: Ventricles are normal in size and configuration. No parenchymal masses or mass effect. No evidence of an infarct. No extra-axial masses or abnormal fluid collections.  No intracranial hemorrhage.  Visualized sinuses and mastoid air cells are clear. No skull lesion.  IMPRESSION: Normal unenhanced CT scan the brain.   Electronically Signed   By: Lajean Manes M.D.   On: 10/21/2014 23:41   Ct Angio Abd/pel W/ And/or W/o  10/21/2014    CLINICAL DATA:  Chest pain  EXAM: CT ANGIOGRAPHY CHEST, ABDOMEN AND PELVIS  TECHNIQUE: Multidetector CT imaging through the chest, abdomen and pelvis was performed using the standard protocol during bolus administration of intravenous contrast. Multiplanar reconstructed images and MIPs were obtained and reviewed to evaluate the vascular anatomy.  CONTRAST:  134mL OMNIPAQUE IOHEXOL 350 MG/ML SOLN  COMPARISON:  None.  FINDINGS: CTA CHEST FINDINGS  THORACIC INLET/BODY WALL:  Thyroidectomy and lower right neck dissection. Indeterminate tissue medial to the left common carotid artery, without definitive adenopathy. Given pulmonary findings, the will likely be updated dedicated staging.  MEDIASTINUM:  Normal heart size. No pericardial effusion. No acute vascular abnormality, including central pulmonary embolism or aortic dissection. No aortic aneurysm or intramural hematoma. No adenopathy.  LUNG WINDOWS:  There are innumerable rounded pulmonary nodules throughout bilateral  lungs. Most of the nodules are less than 1cm, with the largest in the left lower lobe on image 34 measuring 12 mm. In the bilateral apical upper lobes, there is also elongated ill-defined opacities with a small area of cavitation on the right. This is presumably pneumonia; no chart indication of neck radiation.  OSSEOUS:  No acute fracture.  No suspicious lytic or blastic lesions.  Review of the MIP images confirms the above findings.  CTA ABDOMEN AND PELVIS FINDINGS  BODY WALL: Unremarkable.  Liver: No focal abnormality.  Biliary: No evidence of biliary obstruction or stone.  Pancreas: Unremarkable.  Spleen: Unremarkable.  Adrenals: Unremarkable.  Kidneys and ureters: No hydronephrosis or stone.  Bladder: Unremarkable.  Reproductive: Unremarkable.  Bowel: No obstruction. No inflammatory bowel wall thickening.  Retroperitoneum: Prominent lymph nodes along the greater curvature of the stomach and in the gastrohepatic ligament, measuring approximately 1  cm in short axis. No inflammatory or neoplastic appearing gastric wall thickening.  Peritoneum: No ascites or pneumoperitoneum.  Vascular: Standard aortic branching. No significant atherosclerotic change. No aneurysm, dissection, or stenosis.  OSSEOUS: No acute abnormalities.  Review of the MIP images confirms the above findings.  IMPRESSION: 1. Negative aorta. 2. Biapical airspace disease with early cavitation, presumably pneumonia. 3. Innumerable pulmonary nodules, likely metastases from the patient's treated thyroid cancer. 4. Mild perigastric adenopathy. Consider surveillance imaging or endoscopy.   Electronically Signed   By: Jorje Guild M.D.   On: 10/21/2014 23:53    EKG: sinus rhythm, inferior lateral flat to downsloping ST segments, followup EKG appears slightly improved (less downsloping and depression)  Physical Exam: Blood pressure 178/94, pulse 71, temperature 98 F (36.7 C), temperature source Oral, resp. rate 21, weight 63.504 kg (140 lb), SpO2 98 %. General: Well developed, well nourished, in no acute distress. Head: Normocephalic, atraumatic, sclera non-icteric, no xanthomas, nares are without discharge.  Neck: Supple. Negative for carotid bruits. JVD not elevated. Lungs: Clear bilaterally to auscultation without wheezes, rales, or rhonchi. Breathing is unlabored. Heart: RRR with S1 S2. No murmurs, rubs, or gallops appreciated. Abdomen: Soft, non-tender, non-distended with normoactive bowel sounds. No hepatomegaly. No rebound/guarding. No obvious abdominal masses. Msk:  Strength and tone appear normal for age. Extremities: No clubbing or cyanosis. No edema.  Distal pedal pulses are 2+ and equal bilaterally. Unable to appreciate abnormality in thigh bilaterally Neuro: Alert and oriented X 3. Moves all extremities spontaneously. Psych:  Responds to questions appropriately with a normal affect.    Problem List 1. Chest pain, dynamic EKG, mildly elevated troponin --> consistent  with NSTEMI 2. ?Pneumonia 3. Metastatic thyroid cancer, "in remission" 4. Diabetes   Assessment and Plan:  49 y.o. female w/ PMHx significant for DM2, HTN, metastatic thyroid cancer who presented to [facility] on 10/22/2014 with complaints of chest pain, and found to have EKG changes and elevated biomarkers consistent with NSTEMI.  Symptoms are a little atypical (upper chest, "swelling", tenderness to palpation) but her risk factors and the dynamic changes on EKG and mildly elevated troponin are more consistent with cardiac source. PE (kind of pleuritic sound) and dissection (radiating to back) was evaluate with CT chest which negative for CV abnormalities. Also, no coronary calcium on ungated study. Continue to treat with aspirin. Add beta blocker and statin in the acute setting (done). Continue to trend and anticipate early invasive strategy.  The one relative contraindication to cath would be an active infection though I am a little suspect of the PNA diagnosis (with cavities)  considering a cavitating pneumonia is typically much more ill and would at least be febrile or have elevated WBC. Reasonable to treat with abx for now but may be appropriate to re-address with radiology or track down prior studies to determine etiology of bilateral lung cavities.  Summary of Recs:  -continue ASA, heparin, ARB. Added statin and beta blocker.  -questionable PNA diagnosis as per above discussion  -trend troponins, maintain NPO for possible cath  Thank you for this consult please call with questions.   Signed, Elias Else, Louanna Vanliew C. MD 10/22/2014, 2:35 AM

## 2014-10-22 NOTE — Progress Notes (Signed)
Interpreter Julieanne Manson (From language resources) was at bedside to interpret for healthcare team. Staff and patient appreciative of her help. Procedures and plan of care interpreted for patient. Patient expressed understanding and does not have any additional questions or concerns at this time.  Roxan Hockey, RN

## 2014-10-22 NOTE — Progress Notes (Signed)
Patient ID: Jenny [name], female   DOB: 10-Apr-1966, 49 y.o.   MRN: [id number]   SUBJECTIVE: Chest pain has resolved unless she takes a very deep breath.  She is afebrile, WBCs normal, PCT normal.    Interviewed with help of interpreter.   Scheduled Meds: . aspirin  81 mg Oral Daily  . atorvastatin  40 mg Oral q1800  . ceFEPime (MAXIPIME) IV  1 g Intravenous 3 times per day  . insulin aspart  0-9 Units Subcutaneous TID WC  . irbesartan  150 mg Oral Daily  . levothyroxine  200 mcg Oral QAC breakfast  . metoprolol tartrate  12.5 mg Oral BID  . potassium chloride  40 mEq Oral Once  . senna  1 tablet Oral BID  . sodium chloride  3 mL Intravenous Q12H  . vancomycin  1,000 mg Intravenous Q12H   Continuous Infusions: . sodium chloride 100 mL/hr at 10/22/14 0131  . heparin 950 Units/hr (10/22/14 0957)   PRN Meds:.acetaminophen **OR** acetaminophen, morphine injection, ondansetron **OR** ondansetron (ZOFRAN) IV, polyethylene glycol    Filed Vitals:   10/22/14 0700 10/22/14 0800 10/22/14 0900 10/22/14 0925  BP: 134/68 155/68 153/71   Pulse: 55 56 53 75  Temp:      TempSrc:      Resp: 18 17 16    Weight:      SpO2: 95% 95% 98%     Intake/Output Summary (Last 24 hours) at 10/22/14 1102 Last data filed at 10/22/14 0900  Gross per 24 hour  Intake 1120.93 ml  Output   1800 ml  Net -679.07 ml    LABS: Basic Metabolic Panel:  Recent Labs  10/21/14 2128 10/22/14 0200  NA 132* 138  K 4.0 3.1*  CL 93* 102  CO2 30 27  GLUCOSE 550* 326*  BUN 10 5*  CREATININE 0.59 0.40*  CALCIUM 9.4 8.8   Liver Function Tests:  Recent Labs  10/22/14 0200  AST 23  ALT 24  ALKPHOS 133*  BILITOT 0.7  PROT 6.6  ALBUMIN 3.7   No results for input(s): LIPASE, AMYLASE in the last 72 hours. CBC:  Recent Labs  10/21/14 2128 10/22/14 0200  WBC 6.3 6.7  HGB 15.3* 13.8  HCT 41.6 38.5  MCV 84.9 84.2  PLT 188 182   Cardiac Enzymes:  Recent Labs  10/21/14 2128 10/22/14 0728    TROPONINI 0.06* 0.10*   BNP: Invalid input(s): POCBNP D-Dimer: No results for input(s): DDIMER in the last 72 hours. Hemoglobin A1C: No results for input(s): HGBA1C in the last 72 hours. Fasting Lipid Panel: No results for input(s): CHOL, HDL, LDLCALC, TRIG, CHOLHDL, LDLDIRECT in the last 72 hours. Thyroid Function Tests: No results for input(s): TSH, T4TOTAL, T3FREE, THYROIDAB in the last 72 hours.  Invalid input(s): FREET3 Anemia Panel: No results for input(s): VITAMINB12, FOLATE, FERRITIN, TIBC, IRON, RETICCTPCT in the last 72 hours.  RADIOLOGY: Dg Chest 2 View  10/21/2014   CLINICAL DATA:  Chest, arm and jaw pain for 2 days.  EXAM: CHEST  2 VIEW  COMPARISON:  None.  FINDINGS: Cardiac silhouette normal in size and configuration. Normal mediastinal and hilar contours.  Clear lungs.  No pleural effusion or pneumothorax.  Surgical clips noted at the right neck base.  Bony thorax is unremarkable.  IMPRESSION: No active cardiopulmonary disease.   Electronically Signed   By: Lajean Manes M.D.   On: 10/21/2014 22:14   Ct Head Wo Contrast  10/21/2014   CLINICAL DATA:  Headache.  EXAM:  CT HEAD WITHOUT CONTRAST  TECHNIQUE: Contiguous axial images were obtained from the base of the skull through the vertex without intravenous contrast.  COMPARISON:  None.  FINDINGS: Ventricles are normal in size and configuration. No parenchymal masses or mass effect. No evidence of an infarct. No extra-axial masses or abnormal fluid collections.  No intracranial hemorrhage.  Visualized sinuses and mastoid air cells are clear. No skull lesion.  IMPRESSION: Normal unenhanced CT scan the brain.   Electronically Signed   By: Lajean Manes M.D.   On: 10/21/2014 23:41   Ct Angio Chest Aorta W/cm &/or Wo/cm  10/21/2014   CLINICAL DATA:  Chest pain  EXAM: CT ANGIOGRAPHY CHEST, ABDOMEN AND PELVIS  TECHNIQUE: Multidetector CT imaging through the chest, abdomen and pelvis was performed using the standard protocol during  bolus administration of intravenous contrast. Multiplanar reconstructed images and MIPs were obtained and reviewed to evaluate the vascular anatomy.  CONTRAST:  168mL OMNIPAQUE IOHEXOL 350 MG/ML SOLN  COMPARISON:  None.  FINDINGS: CTA CHEST FINDINGS  THORACIC INLET/BODY WALL:  Thyroidectomy and lower right neck dissection. Indeterminate tissue medial to the left common carotid artery, without definitive adenopathy. Given pulmonary findings, the will likely be updated dedicated staging.  MEDIASTINUM:  Normal heart size. No pericardial effusion. No acute vascular abnormality, including central pulmonary embolism or aortic dissection. No aortic aneurysm or intramural hematoma. No adenopathy.  LUNG WINDOWS:  There are innumerable rounded pulmonary nodules throughout bilateral lungs. Most of the nodules are less than 1cm, with the largest in the left lower lobe on image 34 measuring 12 mm. In the bilateral apical upper lobes, there is also elongated ill-defined opacities with a small area of cavitation on the right. This is presumably pneumonia; no chart indication of neck radiation.  OSSEOUS:  No acute fracture.  No suspicious lytic or blastic lesions.  Review of the MIP images confirms the above findings.  CTA ABDOMEN AND PELVIS FINDINGS  BODY WALL: Unremarkable.  Liver: No focal abnormality.  Biliary: No evidence of biliary obstruction or stone.  Pancreas: Unremarkable.  Spleen: Unremarkable.  Adrenals: Unremarkable.  Kidneys and ureters: No hydronephrosis or stone.  Bladder: Unremarkable.  Reproductive: Unremarkable.  Bowel: No obstruction. No inflammatory bowel wall thickening.  Retroperitoneum: Prominent lymph nodes along the greater curvature of the stomach and in the gastrohepatic ligament, measuring approximately 1 cm in short axis. No inflammatory or neoplastic appearing gastric wall thickening.  Peritoneum: No ascites or pneumoperitoneum.  Vascular: Standard aortic branching. No significant atherosclerotic  change. No aneurysm, dissection, or stenosis.  OSSEOUS: No acute abnormalities.  Review of the MIP images confirms the above findings.  IMPRESSION: 1. Negative aorta. 2. Biapical airspace disease with early cavitation, presumably pneumonia. 3. Innumerable pulmonary nodules, likely metastases from the patient's treated thyroid cancer. 4. Mild perigastric adenopathy. Consider surveillance imaging or endoscopy.   Electronically Signed   By: Jorje Guild M.D.   On: 10/21/2014 23:53   Ct Angio Abd/pel W/ And/or W/o  10/21/2014   CLINICAL DATA:  Chest pain  EXAM: CT ANGIOGRAPHY CHEST, ABDOMEN AND PELVIS  TECHNIQUE: Multidetector CT imaging through the chest, abdomen and pelvis was performed using the standard protocol during bolus administration of intravenous contrast. Multiplanar reconstructed images and MIPs were obtained and reviewed to evaluate the vascular anatomy.  CONTRAST:  188mL OMNIPAQUE IOHEXOL 350 MG/ML SOLN  COMPARISON:  None.  FINDINGS: CTA CHEST FINDINGS  THORACIC INLET/BODY WALL:  Thyroidectomy and lower right neck dissection. Indeterminate tissue medial to the left common carotid  artery, without definitive adenopathy. Given pulmonary findings, the will likely be updated dedicated staging.  MEDIASTINUM:  Normal heart size. No pericardial effusion. No acute vascular abnormality, including central pulmonary embolism or aortic dissection. No aortic aneurysm or intramural hematoma. No adenopathy.  LUNG WINDOWS:  There are innumerable rounded pulmonary nodules throughout bilateral lungs. Most of the nodules are less than 1cm, with the largest in the left lower lobe on image 34 measuring 12 mm. In the bilateral apical upper lobes, there is also elongated ill-defined opacities with a small area of cavitation on the right. This is presumably pneumonia; no chart indication of neck radiation.  OSSEOUS:  No acute fracture.  No suspicious lytic or blastic lesions.  Review of the MIP images confirms the above  findings.  CTA ABDOMEN AND PELVIS FINDINGS  BODY WALL: Unremarkable.  Liver: No focal abnormality.  Biliary: No evidence of biliary obstruction or stone.  Pancreas: Unremarkable.  Spleen: Unremarkable.  Adrenals: Unremarkable.  Kidneys and ureters: No hydronephrosis or stone.  Bladder: Unremarkable.  Reproductive: Unremarkable.  Bowel: No obstruction. No inflammatory bowel wall thickening.  Retroperitoneum: Prominent lymph nodes along the greater curvature of the stomach and in the gastrohepatic ligament, measuring approximately 1 cm in short axis. No inflammatory or neoplastic appearing gastric wall thickening.  Peritoneum: No ascites or pneumoperitoneum.  Vascular: Standard aortic branching. No significant atherosclerotic change. No aneurysm, dissection, or stenosis.  OSSEOUS: No acute abnormalities.  Review of the MIP images confirms the above findings.  IMPRESSION: 1. Negative aorta. 2. Biapical airspace disease with early cavitation, presumably pneumonia. 3. Innumerable pulmonary nodules, likely metastases from the patient's treated thyroid cancer. 4. Mild perigastric adenopathy. Consider surveillance imaging or endoscopy.   Electronically Signed   By: Jorje Guild M.D.   On: 10/21/2014 23:53    PHYSICAL EXAM General: NAD Neck: No JVD, no thyromegaly or thyroid nodule.  Lungs: Mild crackles left upper lung field CV: Nondisplaced PMI.  Heart regular S1/S2, no S3/S4, no murmur.  No peripheral edema.  No carotid bruit.  Normal pedal pulses.  Abdomen: Soft, nontender, no hepatosplenomegaly, no distention.  Neurologic: Alert and oriented x 3.  Psych: Normal affect. Extremities: No clubbing or cyanosis.   TELEMETRY: Reviewed telemetry pt in NSR  ASSESSMENT AND PLAN: 48 yo with HTN, diabetes and metastatic thyroid cancer presented with chest pain and was found to have mildly elevated troponin.  She also was found to have bilateral upper lung airspace disease with some cavitation on the right.  1.  Elevated troponin: Patient's chest pain is mostly resolved.  It was pleuritic in nature.  Troponin increased to 0.1 and she had inferior TWIs (no prior). CTA chest was negative for PE or dissection.  It also did not show coronary artery calcium.  She does have risk factors for CAD, HTN and DM.  No definite event has occurred that could cause demand ischemia (no tachycardia, hypotension, etc).  Cannot rule out Takotsubo-type event, but I do think that she will need further workup, likely by cardiac cath.  - Agree with ASA 81, statin, heparin gtt, metoprolol. - Echo today.  - Will need further evaluation of lung lesions by primary service => active infection versus old/quiescent today.  Would also like some idea of her prognosis from her metastatic thyroid cancer.  Will tentatively plan cardiac cath tomorrow as she is currently stable.  2. Lung lesions: Patient has bilateral innumerable lung nodules from known metastastic thyroid cancer.  She also has bilateral apical airspace disease with  some cavitation on the right.  Last CT from [facility] in 12/15 showed bilateral apical ground glass.  ?Active infection versus prior infection/cancer mets.  Active infection seems less likely given PCT < 0.1, normal WBCs, and afebrile.  Currently covered by abx.  3. Metastatic thyroid cancer: Followed at [facility].  Known metastatic disease.  I am unsure of the prognosis here. This would be helpful to know prior to cardiac cath.   [name] 10/22/2014 11:14 AM

## 2014-10-22 NOTE — Progress Notes (Signed)
Patient admitted earlier this morning by Dr. Linna Darner.  Agree with current assessment and plan.    This is an unfortunate 49 year old female with a history of diabetes and hypertension, thyroid cancer who presented to the emergency department with complaints of chest pain. Chest pain associated with headache, shortness of breath and palpitations. There was radiation of pain to the left neck back as well as left arm.   Patient seen and examined Vital signs stable General: well developed, well nourished, no apparent distress HEENT: Normocephalic atraumatic Cardiovascular: Normal S1-S2, no murmurs appreciated Respiratory: Mild crackles noted in the upper left lung field, otherwise clear Abdomen: Soft nontender nondistended, positive bowel sounds Extremities: No clubbing cyanosis or edema  Assessment and plan Chest pain/elevated troponin -Patient chest pain does appear to be pleuritic in nature -Troponin did increase to 0.1, will continue to cycle -Cardiology consulted and appreciated with plans for cardiac catheterization 10/22/2014 -Continue aspirin, heparin drip, metoprolol, statin -Echocardiogram pending  Metastatic thyroid cancer -Followed by [facility] -Patient has known metastases to lungs, abdomen, axilla -Spoke with radiology regarding CTA done upon admission, unlikely pneumonia, likely pulmonary nodules. No PE noted. There was patchy consolidation however unlikely pneumonia. -Spoke with patient, using translator, regarding palliative care, patient is agreeable.   Time spent:45 minutes  Jenny [name] D.O. [facility] Pager [phone]  If 7PM-7AM, please contact night-coverage [email or web address] Password [email or web address] 10/22/2014, 12:11 PM

## 2014-10-23 ENCOUNTER — Encounter (HOSPITAL_COMMUNITY)
Admission: EM | Disposition: A | Discharge status: Home / Self Care | Payer: Self-pay | Source: Home / Self Care | Attending: Internal Medicine

## 2014-10-23 ENCOUNTER — Encounter (HOSPITAL_COMMUNITY): Payer: Self-pay | Admitting: Cardiology

## 2014-10-23 DIAGNOSIS — I214 Non-ST elevation (NSTEMI) myocardial infarction: Secondary | ICD-10-CM

## 2014-10-23 DIAGNOSIS — R918 Other nonspecific abnormal finding of lung field: Secondary | ICD-10-CM

## 2014-10-23 DIAGNOSIS — R0789 Other chest pain: Secondary | ICD-10-CM

## 2014-10-23 DIAGNOSIS — I519 Heart disease, unspecified: Secondary | ICD-10-CM

## 2014-10-23 DIAGNOSIS — C73 Malignant neoplasm of thyroid gland: Secondary | ICD-10-CM

## 2014-10-23 DIAGNOSIS — C801 Malignant (primary) neoplasm, unspecified: Secondary | ICD-10-CM

## 2014-10-23 DIAGNOSIS — E119 Type 2 diabetes mellitus without complications: Secondary | ICD-10-CM

## 2014-10-23 DIAGNOSIS — I10 Essential (primary) hypertension: Secondary | ICD-10-CM

## 2014-10-23 DIAGNOSIS — J189 Pneumonia, unspecified organism: Secondary | ICD-10-CM

## 2014-10-23 HISTORY — PX: LEFT HEART CATHETERIZATION WITH CORONARY ANGIOGRAM: SHX5451

## 2014-10-23 LAB — BASIC METABOLIC PANEL
ANION GAP: 9 (ref 5–15)
CHLORIDE: 102 meq/L (ref 96–112)
CO2: 27 mmol/L (ref 19–32)
CREATININE: 0.39 mg/dL — AB (ref 0.50–1.10)
Calcium: 8.8 mg/dL (ref 8.4–10.5)
Glucose, Bld: 263 mg/dL — ABNORMAL HIGH (ref 70–99)
Potassium: 3.6 mmol/L (ref 3.5–5.1)
Sodium: 138 mmol/L (ref 135–145)

## 2014-10-23 LAB — GLUCOSE, CAPILLARY
GLUCOSE-CAPILLARY: 193 mg/dL — AB (ref 70–99)
Glucose-Capillary: 248 mg/dL — ABNORMAL HIGH (ref 70–99)
Glucose-Capillary: 449 mg/dL — ABNORMAL HIGH (ref 70–99)
Glucose-Capillary: 335 mg/dL — ABNORMAL HIGH (ref 70–99)
Glucose-Capillary: 113 mg/dL — ABNORMAL HIGH (ref 70–99)

## 2014-10-23 LAB — LEGIONELLA ANTIGEN, URINE

## 2014-10-23 LAB — TROPONIN I
TROPONIN I: 0.04 ng/mL — AB (ref <0.031)
Troponin I: 0.03 ng/mL (ref <0.031)
Troponin I: <0.03 ng/mL (ref <0.031)
Troponin I: <0.03 ng/mL (ref <0.031)

## 2014-10-23 LAB — CBC
HCT: 37.9 % (ref 36.0–46.0)
Hemoglobin: 13.6 g/dL (ref 12.0–15.0)
MCH: 30.1 pg (ref 26.0–34.0)
MCHC: 35.9 g/dL (ref 30.0–36.0)
MCV: 83.8 fL (ref 78.0–100.0)
Platelets: 169 10*3/uL (ref 150–400)
RBC: 4.52 MIL/uL (ref 3.87–5.11)
RDW: 11.6 % (ref 11.5–15.5)
WBC: 5.3 10*3/uL (ref 4.0–10.5)

## 2014-10-23 LAB — HIV ANTIBODY (ROUTINE TESTING W REFLEX)
HIV 1/O/2 Abs-Index Value: <1 (ref <1.00)
HIV-1/HIV-2 Ab: NONREACTIVE

## 2014-10-23 LAB — PROTIME-INR
INR: 1.03 (ref 0.00–1.49)
PROTHROMBIN TIME: 13.6 s (ref 11.6–15.2)

## 2014-10-23 LAB — GLUCOSE, RANDOM: Glucose, Bld: 100 mg/dL — ABNORMAL HIGH (ref 70–99)

## 2014-10-23 LAB — HEPARIN LEVEL (UNFRACTIONATED): Heparin Unfractionated: 0.38 IU/mL (ref 0.30–0.70)

## 2014-10-23 SURGERY — LEFT HEART CATHETERIZATION WITH CORONARY ANGIOGRAM
Anesthesia: LOCAL

## 2014-10-23 MED ORDER — SODIUM CHLORIDE 0.9 % IV SOLN: 250.0000 mL | INTRAVENOUS | Status: DC | PRN

## 2014-10-23 MED ORDER — INSULIN ASPART 100 UNIT/ML ~~LOC~~ SOLN
0.0000 [IU] | Freq: Three times a day (TID) | SUBCUTANEOUS | Status: DC
  Administered 2014-10-23: 15 [IU] via SUBCUTANEOUS
  Administered 2014-10-24: 4 [IU] via SUBCUTANEOUS
  Administered 2014-10-24: 11 [IU] via SUBCUTANEOUS

## 2014-10-23 MED ORDER — LIDOCAINE HCL (PF) 1 % IJ SOLN
INTRAMUSCULAR | Status: AC
  Filled 2014-10-23: qty 30

## 2014-10-23 MED ORDER — SODIUM CHLORIDE 0.9 % IJ SOLN: 3.0000 mL | INTRAMUSCULAR | Status: DC | PRN

## 2014-10-23 MED ORDER — ISOSORBIDE MONONITRATE ER 30 MG PO TB24
30.0000 mg | ORAL_TABLET | Freq: Every day | ORAL | Status: DC
  Administered 2014-10-23 – 2014-10-24 (×2): 30 mg via ORAL
  Filled 2014-10-23 (×2): qty 1

## 2014-10-23 MED ORDER — NITROGLYCERIN 1 MG/10 ML FOR IR/CATH LAB
INTRA_ARTERIAL | Status: AC
  Filled 2014-10-23: qty 10

## 2014-10-23 MED ORDER — INSULIN ASPART 100 UNIT/ML ~~LOC~~ SOLN
20.0000 [IU] | Freq: Once | SUBCUTANEOUS | Status: AC
Start: 2014-10-23 — End: 2014-10-23
  Administered 2014-10-23: 20 [IU] via SUBCUTANEOUS

## 2014-10-23 MED ORDER — PNEUMOCOCCAL VAC POLYVALENT 25 MCG/0.5ML IJ INJ
0.5000 mL | INJECTION | INTRAMUSCULAR | Status: DC
  Filled 2014-10-23: qty 0.5

## 2014-10-23 MED ORDER — ASPIRIN 81 MG PO CHEW: 81.0000 mg | CHEWABLE_TABLET | ORAL | Status: DC

## 2014-10-23 MED ORDER — MIDAZOLAM HCL 2 MG/2ML IJ SOLN
INTRAMUSCULAR | Status: AC
  Filled 2014-10-23: qty 2

## 2014-10-23 MED ORDER — HEPARIN (PORCINE) IN NACL 2-0.9 UNIT/ML-% IJ SOLN
INTRAMUSCULAR | Status: AC
  Filled 2014-10-23: qty 1000

## 2014-10-23 MED ORDER — ASPIRIN 81 MG PO CHEW
81.0000 mg | CHEWABLE_TABLET | ORAL | Status: AC
  Administered 2014-10-23: 81 mg via ORAL
  Filled 2014-10-23: qty 1

## 2014-10-23 MED ORDER — INSULIN ASPART 100 UNIT/ML ~~LOC~~ SOLN: 0.0000 [IU] | Freq: Every day | SUBCUTANEOUS | Status: DC

## 2014-10-23 MED ORDER — SODIUM CHLORIDE 0.9 % IJ SOLN: 3.0000 mL | Freq: Two times a day (BID) | INTRAMUSCULAR | Status: DC

## 2014-10-23 MED ORDER — INSULIN GLARGINE 100 UNIT/ML ~~LOC~~ SOLN
12.0000 [IU] | Freq: Every day | SUBCUTANEOUS | Status: DC
  Administered 2014-10-23: 12 [IU] via SUBCUTANEOUS
  Filled 2014-10-23 (×2): qty 0.12

## 2014-10-23 MED ORDER — HEPARIN SODIUM (PORCINE) 1000 UNIT/ML IJ SOLN
INTRAMUSCULAR | Status: AC
  Filled 2014-10-23: qty 1

## 2014-10-23 MED ORDER — ACETAMINOPHEN 325 MG PO TABS: 650.0000 mg | ORAL_TABLET | ORAL | Status: DC | PRN

## 2014-10-23 MED ORDER — FENTANYL CITRATE 0.05 MG/ML IJ SOLN
INTRAMUSCULAR | Status: AC
  Filled 2014-10-23: qty 2

## 2014-10-23 MED ORDER — VERAPAMIL HCL 2.5 MG/ML IV SOLN
INTRAVENOUS | Status: AC
  Filled 2014-10-23: qty 2

## 2014-10-23 MED ORDER — SODIUM CHLORIDE 0.9 % IV SOLN: INTRAVENOUS | Status: DC

## 2014-10-23 MED ORDER — SODIUM CHLORIDE 0.9 % IV SOLN: 1.0000 mL/kg/h | INTRAVENOUS | Status: DC

## 2014-10-23 MED ORDER — ONDANSETRON HCL 4 MG/2ML IJ SOLN: 4.0000 mg | Freq: Four times a day (QID) | INTRAMUSCULAR | Status: DC | PRN

## 2014-10-23 NOTE — Progress Notes (Signed)
ANTICOAGULATION CONSULT NOTE - Follow Up Consult  Pharmacy Consult for Heparin  Indication: chest pain/ACS  No Known Allergies  Patient Measurements: Height: 5\' 2"  (157.5 cm) (estimated) Weight: 140 lb (63.504 kg) IBW/kg (Calculated) : 50.1  Vital Signs: Temp: 98.1 F (36.7 C) (01/20 0000) Temp Source: Oral (01/20 0000) BP: 163/83 mmHg (01/20 0000) Pulse Rate: 61 (01/20 0000)  Labs:  Recent Labs  10/21/14 2128 10/22/14 0200 10/22/14 0716  10/22/14 1315 10/22/14 1525 10/22/14 1900 10/23/14 0130  HGB 15.3* 13.8  --   --   --   --   --  13.6  HCT 41.6 38.5  --   --   --   --   --  37.9  PLT 188 182  --   --   --   --   --  169  HEPARINUNFRC  --   --  0.31  --   --  0.27*  --  0.38  CREATININE 0.59 0.40*  --   --   --   --   --  0.39*  TROPONINI 0.06*  --   --   < > 0.06*  --  0.04* 0.04*  < > = values in this interval not displayed.  Estimated Creatinine Clearance: 73.7 mL/min (by C-G formula based on Cr of 0.39).  Assessment: Therapeutic heparin level x 1 after rate increase. Cath this AM.   Goal of Therapy:  Heparin level 0.3-0.7 units/ml Monitor platelets by anticoagulation protocol: Yes   Plan:  -Continue heparin at 1100 units/hr -F/U post-cath  Narda Bonds 10/23/2014,2:59 AM

## 2014-10-23 NOTE — Interval H&P Note (Signed)
History and Physical Interval Note:  10/23/2014 11:02 AM  Jenny [name]  has presented today for surgery, with the diagnosis of heart failure  The various methods of treatment have been discussed with the patient. After consideration of risks, benefits and other options for treatment, the patient has consented to  Procedure(s): LEFT HEART CATHETERIZATION WITH CORONARY ANGIOGRAM (N/A) as a surgical intervention .  The patient's history has been reviewed, patient examined, no change in status, stable for surgery.  I have reviewed the patient's chart and labs.  Questions were answered to the patient's satisfaction.     [name]  Cath Lab Visit (complete for each Cath Lab visit)  Clinical Evaluation Leading to the Procedure:   ACS: Yes.    Non-ACS:    Anginal Classification: CCS IV  Anti-ischemic medical therapy: No Therapy  Non-Invasive Test Results: No non-invasive testing performed  Prior CABG: No previous CABG

## 2014-10-23 NOTE — Progress Notes (Addendum)
[facility] PROGRESS NOTE  Keeghan Mcintire QZE:[id number] DOB: [birth year] DOA: 10/21/2014 PCP: Lorayne Marek, MD  Assessment/Plan: 1. Chest pain -Patient presenting with atypical chest pain, undergoing heart cath on 10/23/2014 which did not reveal significant coronary artery disease. -Awaiting transthoracic echocardiogram to assess for possible stress-induced cardiomyopathy -CT scan of lungs do not show evidence for dissection or pulmonary embolism -Chest pain could be related to metastatic thyroid cancer involving lungs. CT scans showing an pole rounded pulmonary nodules throughout bilateral lungs -She does not appear to have clinical signs/symptoms suggestive of pneumonia, wi'll discontinue IV vancomycin and cefepime and monitor off of antibiotics. Will repeat CXR in AM to ensure stability.   2.  Metastatic thyroid cancer -Patient with known history of metastatic thyroid cancer receiving her care at [place] of lungs showing multiple nodules involving bilateral lungs consistent with metastatic disease -Care consultation placed  3.  Type 2 diabetes mellitus -Hemoglobin A1c 12.9 -Patient presenting with hyperglycemia -Start Lantus 12 units [place] q daily  4.  Hypertension -Patient on irbesartan 150 mg by mouth daily and metoprolol 12.5 mg by mouth twice a day -Blood pressures remain elevated, will add Imdur 30 mg by mouth daily  Code Status: Full Code Family Communication: Family not present Disposition Plan: Monitor overnight, anticipate discharge home when medically stable   Consultants:  Cardiology  Procedures:  Cardiac catheterization performed on 10/23/2014  Antibiotics:  Cefepime (stopped on 10/23/2014)  Vancomycin (stopped on 10/23/2014)  HPI/Subjective: Patient is a pleasant 49 year old female with a past medical history of diabetes, hypertension, thyroid cancer, admitted to the medicine service on 10/21/2014 when she presented with complaints of  chest pain. She was found to have elevated troponin of 0.1. Chest x-ray did not reveal acute cardiopulmonary disease. Initially she was felt to have an NSTEMI for which she was started on anticoagulation with IV heparin. Cardiology was consulted as she was taken to the Cath Lab on 10/23/2014. Left heart cath did not reveal angiographic coronary artery disease. Procedure was performed by Dr Aundra Dubin.   Objective: Filed Vitals:   10/23/14 1330  BP: 154/77  Pulse: 70  Temp:   Resp: 18    Intake/Output Summary (Last 24 hours) at 10/23/14 1351 Last data filed at 10/23/14 0600  Gross per 24 hour  Intake   2521 ml  Output   1300 ml  Net   1221 ml   Filed Weights   10/21/14 2122  Weight: 63.504 kg (140 lb)    Exam:   General:  Patient is awake, alert, no acute distress  Cardiovascular: Regular rate and rhythm normal S1-S2  Respiratory: Normal respiratory effort, lungs clear to auscultation bilaterally  Abdomen: Soft nontender nondistended  Musculoskeletal: No edema  Data Reviewed: Basic Metabolic Panel:  Recent Labs Lab 10/21/14 2128 10/22/14 0200 10/23/14 0130  NA 132* 138 138  K 4.0 3.1* 3.6  CL 93* 102 102  CO2 30 27 27   GLUCOSE 550* 326* 263*  BUN 10 5* <5*  CREATININE 0.59 0.40* 0.39*  CALCIUM 9.4 8.8 8.8   Liver Function Tests:  Recent Labs Lab 10/22/14 0200  AST 23  ALT 24  ALKPHOS 133*  BILITOT 0.7  PROT 6.6  ALBUMIN 3.7   No results for input(s): LIPASE, AMYLASE in the last 168 hours. No results for input(s): AMMONIA in the last 168 hours. CBC:  Recent Labs Lab 10/21/14 2128 10/22/14 0200 10/23/14 0130  WBC 6.3 6.7 5.3  HGB 15.3* 13.8 13.6  HCT 41.6 38.5 37.9  MCV 84.9 84.2 83.8  PLT 188 182 169   Cardiac Enzymes:  Recent Labs Lab 10/22/14 0728 10/22/14 1315 10/22/14 1900 10/23/14 0130 10/23/14 0810  TROPONINI 0.10* 0.06* 0.04* 0.04* 0.03   BNP (last 3 results) No results for input(s): PROBNP in the last 8760  hours. CBG:  Recent Labs Lab 10/22/14 1139 10/22/14 1745 10/22/14 2157 10/23/14 0917 10/23/14 1150  GLUCAP 96 309* 163* 248* 193*    Recent Results (from the past 240 hour(s))  Culture, blood (routine x 2)     Status: None (Preliminary result)   Collection Time: 10/22/14 12:30 AM  Result Value Ref Range Status   Specimen Description BLOOD RIGHT ANTECUBITAL  Final   Special Requests BOTTLES DRAWN AEROBIC AND ANAEROBIC 10CC EA  Final   Culture   Final           BLOOD CULTURE RECEIVED NO GROWTH TO DATE CULTURE WILL BE HELD FOR 5 DAYS BEFORE ISSUING A FINAL NEGATIVE REPORT Performed at [facility]    Report Status PENDING  Incomplete  Culture, blood (routine x 2)     Status: None (Preliminary result)   Collection Time: 10/22/14 12:38 AM  Result Value Ref Range Status   Specimen Description BLOOD RIGHT FOREARM  Final   Special Requests BOTTLES DRAWN AEROBIC AND ANAEROBIC 10CC EA  Final   Culture   Final           BLOOD CULTURE RECEIVED NO GROWTH TO DATE CULTURE WILL BE HELD FOR 5 DAYS BEFORE ISSUING A FINAL NEGATIVE REPORT Performed at [facility]    Report Status PENDING  Incomplete  MRSA PCR Screening     Status: None   Collection Time: 10/22/14  1:29 AM  Result Value Ref Range Status   MRSA by PCR NEGATIVE NEGATIVE Final    Comment:        The GeneXpert MRSA Assay (FDA approved for NASAL specimens only), is one component of a comprehensive MRSA colonization surveillance program. It is not intended to diagnose MRSA infection nor to guide or monitor treatment for MRSA infections.      Studies: Dg Chest 2 View  10/21/2014   CLINICAL DATA:  Chest, arm and jaw pain for 2 days.  EXAM: CHEST  2 VIEW  COMPARISON:  None.  FINDINGS: Cardiac silhouette normal in size and configuration. Normal mediastinal and hilar contours.  Clear lungs.  No pleural effusion or pneumothorax.  Surgical clips noted at the right neck base.  Bony thorax is unremarkable.   IMPRESSION: No active cardiopulmonary disease.   Electronically Signed   By: Lajean Manes M.D.   On: 10/21/2014 22:14   Ct Head Wo Contrast  10/21/2014   CLINICAL DATA:  Headache.  EXAM: CT HEAD WITHOUT CONTRAST  TECHNIQUE: Contiguous axial images were obtained from the base of the skull through the vertex without intravenous contrast.  COMPARISON:  None.  FINDINGS: Ventricles are normal in size and configuration. No parenchymal masses or mass effect. No evidence of an infarct. No extra-axial masses or abnormal fluid collections.  No intracranial hemorrhage.  Visualized sinuses and mastoid air cells are clear. No skull lesion.  IMPRESSION: Normal unenhanced CT scan the brain.   Electronically Signed   By: Lajean Manes M.D.   On: 10/21/2014 23:41   Ct Angio Chest Aorta W/cm &/or Wo/cm  10/21/2014   CLINICAL DATA:  Chest pain  EXAM: CT ANGIOGRAPHY CHEST, ABDOMEN AND PELVIS  TECHNIQUE: Multidetector CT imaging through the chest, abdomen and  pelvis was performed using the standard protocol during bolus administration of intravenous contrast. Multiplanar reconstructed images and MIPs were obtained and reviewed to evaluate the vascular anatomy.  CONTRAST:  141mL OMNIPAQUE IOHEXOL 350 MG/ML SOLN  COMPARISON:  None.  FINDINGS: CTA CHEST FINDINGS  THORACIC INLET/BODY WALL:  Thyroidectomy and lower right neck dissection. Indeterminate tissue medial to the left common carotid artery, without definitive adenopathy. Given pulmonary findings, the will likely be updated dedicated staging.  MEDIASTINUM:  Normal heart size. No pericardial effusion. No acute vascular abnormality, including central pulmonary embolism or aortic dissection. No aortic aneurysm or intramural hematoma. No adenopathy.  LUNG WINDOWS:  There are innumerable rounded pulmonary nodules throughout bilateral lungs. Most of the nodules are less than 1cm, with the largest in the left lower lobe on image 34 measuring 12 mm. In the bilateral apical upper  lobes, there is also elongated ill-defined opacities with a small area of cavitation on the right. This is presumably pneumonia; no chart indication of neck radiation.  OSSEOUS:  No acute fracture.  No suspicious lytic or blastic lesions.  Review of the MIP images confirms the above findings.  CTA ABDOMEN AND PELVIS FINDINGS  BODY WALL: Unremarkable.  Liver: No focal abnormality.  Biliary: No evidence of biliary obstruction or stone.  Pancreas: Unremarkable.  Spleen: Unremarkable.  Adrenals: Unremarkable.  Kidneys and ureters: No hydronephrosis or stone.  Bladder: Unremarkable.  Reproductive: Unremarkable.  Bowel: No obstruction. No inflammatory bowel wall thickening.  Retroperitoneum: Prominent lymph nodes along the greater curvature of the stomach and in the gastrohepatic ligament, measuring approximately 1 cm in short axis. No inflammatory or neoplastic appearing gastric wall thickening.  Peritoneum: No ascites or pneumoperitoneum.  Vascular: Standard aortic branching. No significant atherosclerotic change. No aneurysm, dissection, or stenosis.  OSSEOUS: No acute abnormalities.  Review of the MIP images confirms the above findings.  IMPRESSION: 1. Negative aorta. 2. Biapical airspace disease with early cavitation, presumably pneumonia. 3. Innumerable pulmonary nodules, likely metastases from the patient's treated thyroid cancer. 4. Mild perigastric adenopathy. Consider surveillance imaging or endoscopy.   Electronically Signed   By: Jorje Guild M.D.   On: 10/21/2014 23:53   Ct Angio Abd/pel W/ And/or W/o  10/21/2014   CLINICAL DATA:  Chest pain  EXAM: CT ANGIOGRAPHY CHEST, ABDOMEN AND PELVIS  TECHNIQUE: Multidetector CT imaging through the chest, abdomen and pelvis was performed using the standard protocol during bolus administration of intravenous contrast. Multiplanar reconstructed images and MIPs were obtained and reviewed to evaluate the vascular anatomy.  CONTRAST:  168mL OMNIPAQUE IOHEXOL 350 MG/ML  SOLN  COMPARISON:  None.  FINDINGS: CTA CHEST FINDINGS  THORACIC INLET/BODY WALL:  Thyroidectomy and lower right neck dissection. Indeterminate tissue medial to the left common carotid artery, without definitive adenopathy. Given pulmonary findings, the will likely be updated dedicated staging.  MEDIASTINUM:  Normal heart size. No pericardial effusion. No acute vascular abnormality, including central pulmonary embolism or aortic dissection. No aortic aneurysm or intramural hematoma. No adenopathy.  LUNG WINDOWS:  There are innumerable rounded pulmonary nodules throughout bilateral lungs. Most of the nodules are less than 1cm, with the largest in the left lower lobe on image 34 measuring 12 mm. In the bilateral apical upper lobes, there is also elongated ill-defined opacities with a small area of cavitation on the right. This is presumably pneumonia; no chart indication of neck radiation.  OSSEOUS:  No acute fracture.  No suspicious lytic or blastic lesions.  Review of the MIP images confirms the above  findings.  CTA ABDOMEN AND PELVIS FINDINGS  BODY WALL: Unremarkable.  Liver: No focal abnormality.  Biliary: No evidence of biliary obstruction or stone.  Pancreas: Unremarkable.  Spleen: Unremarkable.  Adrenals: Unremarkable.  Kidneys and ureters: No hydronephrosis or stone.  Bladder: Unremarkable.  Reproductive: Unremarkable.  Bowel: No obstruction. No inflammatory bowel wall thickening.  Retroperitoneum: Prominent lymph nodes along the greater curvature of the stomach and in the gastrohepatic ligament, measuring approximately 1 cm in short axis. No inflammatory or neoplastic appearing gastric wall thickening.  Peritoneum: No ascites or pneumoperitoneum.  Vascular: Standard aortic branching. No significant atherosclerotic change. No aneurysm, dissection, or stenosis.  OSSEOUS: No acute abnormalities.  Review of the MIP images confirms the above findings.  IMPRESSION: 1. Negative aorta. 2. Biapical airspace disease  with early cavitation, presumably pneumonia. 3. Innumerable pulmonary nodules, likely metastases from the patient's treated thyroid cancer. 4. Mild perigastric adenopathy. Consider surveillance imaging or endoscopy.   Electronically Signed   By: Jorje Guild M.D.   On: 10/21/2014 23:53    Scheduled Meds: . aspirin  81 mg Oral Daily  . atorvastatin  40 mg Oral q1800  . ceFEPime (MAXIPIME) IV  1 g Intravenous 3 times per day  . insulin aspart  0-9 Units Subcutaneous TID WC  . irbesartan  150 mg Oral Daily  . isosorbide mononitrate  30 mg Oral Daily  . levothyroxine  200 mcg Oral QAC breakfast  . metoprolol tartrate  12.5 mg Oral BID  . senna  1 tablet Oral BID  . sodium chloride  3 mL Intravenous Q12H  . vancomycin  1,000 mg Intravenous Q12H   Continuous Infusions: . sodium chloride 100 mL/hr at 10/23/14 0033  . sodium chloride 450 mL (10/23/14 1156)  . heparin 1,100 Units/hr (10/23/14 0033)    Principal Problem:   Chest pain Active Problems:   Essential hypertension, benign   Thyroid cancer   Bilateral pneumonia   Metastatic disease   Type 2 diabetes mellitus without complication   Essential hypertension    Time spent: 35 min    Kelvin Cellar  [facility] Pager [phone]. If 7PM-7AM, please contact night-coverage at [email or web address], password [facility] 10/23/2014, 1:51 PM  LOS: 2 days

## 2014-10-23 NOTE — Progress Notes (Signed)
Echocardiogram 2D Echocardiogram has been performed.  Jenny [name] 10/23/2014, 8:21 AM

## 2014-10-23 NOTE — H&P (View-Only) (Signed)
Patient ID: Jenny [name], female   DOB: 10-07-1965, 49 y.o.   MRN: [id number]   SUBJECTIVE: No further chest pain.  Troponin trended down to 0.4.  No echo done yet.  No complaints this morning.   Scheduled Meds: . aspirin  81 mg Oral Daily  . aspirin  81 mg Oral Pre-Cath  . atorvastatin  40 mg Oral q1800  . ceFEPime (MAXIPIME) IV  1 g Intravenous 3 times per day  . insulin aspart  0-9 Units Subcutaneous TID WC  . irbesartan  150 mg Oral Daily  . levothyroxine  200 mcg Oral QAC breakfast  . metoprolol tartrate  12.5 mg Oral BID  . senna  1 tablet Oral BID  . sodium chloride  3 mL Intravenous Q12H  . sodium chloride  3 mL Intravenous Q12H  . vancomycin  1,000 mg Intravenous Q12H   Continuous Infusions: . sodium chloride 100 mL/hr at 10/23/14 0033  . [START ON 10/24/2014] sodium chloride    . heparin 1,100 Units/hr (10/23/14 0033)   PRN Meds:.sodium chloride, acetaminophen **OR** acetaminophen, morphine injection, ondansetron **OR** ondansetron (ZOFRAN) IV, polyethylene glycol, sodium chloride    Filed Vitals:   10/22/14 1800 10/22/14 1900 10/23/14 0000 10/23/14 0424  BP: 160/73 161/67 163/83 157/88  Pulse:  68 61 58  Temp:  98.5 F (36.9 C) 98.1 F (36.7 C) 97.6 F (36.4 C)  TempSrc:  Oral Oral Oral  Resp: 20 20 21 19   Height:      Weight:      SpO2:  97% 98% 98%    Intake/Output Summary (Last 24 hours) at 10/23/14 0726 Last data filed at 10/22/14 1750  Gross per 24 hour  Intake    518 ml  Output   1000 ml  Net   -482 ml    LABS: Basic Metabolic Panel:  Recent Labs  10/22/14 0200 10/23/14 0130  NA 138 138  K 3.1* 3.6  CL 102 102  CO2 27 27  GLUCOSE 326* 263*  BUN 5* <5*  CREATININE 0.40* 0.39*  CALCIUM 8.8 8.8   Liver Function Tests:  Recent Labs  10/22/14 0200  AST 23  ALT 24  ALKPHOS 133*  BILITOT 0.7  PROT 6.6  ALBUMIN 3.7   No results for input(s): LIPASE, AMYLASE in the last 72 hours. CBC:  Recent Labs  10/22/14 0200 10/23/14 0130    WBC 6.7 5.3  HGB 13.8 13.6  HCT 38.5 37.9  MCV 84.2 83.8  PLT 182 169   Cardiac Enzymes:  Recent Labs  10/22/14 1315 10/22/14 1900 10/23/14 0130  TROPONINI 0.06* 0.04* 0.04*   BNP: Invalid input(s): POCBNP D-Dimer: No results for input(s): DDIMER in the last 72 hours. Hemoglobin A1C:  Recent Labs  10/22/14 0030  HGBA1C 12.9*   Fasting Lipid Panel: No results for input(s): CHOL, HDL, LDLCALC, TRIG, CHOLHDL, LDLDIRECT in the last 72 hours. Thyroid Function Tests: No results for input(s): TSH, T4TOTAL, T3FREE, THYROIDAB in the last 72 hours.  Invalid input(s): FREET3 Anemia Panel: No results for input(s): VITAMINB12, FOLATE, FERRITIN, TIBC, IRON, RETICCTPCT in the last 72 hours.  RADIOLOGY: Dg Chest 2 View  10/21/2014   CLINICAL DATA:  Chest, arm and jaw pain for 2 days.  EXAM: CHEST  2 VIEW  COMPARISON:  None.  FINDINGS: Cardiac silhouette normal in size and configuration. Normal mediastinal and hilar contours.  Clear lungs.  No pleural effusion or pneumothorax.  Surgical clips noted at the right neck base.  Bony thorax is unremarkable.  IMPRESSION: No active cardiopulmonary disease.   Electronically Signed   By: Lajean Manes M.D.   On: 10/21/2014 22:14   Ct Head Wo Contrast  10/21/2014   CLINICAL DATA:  Headache.  EXAM: CT HEAD WITHOUT CONTRAST  TECHNIQUE: Contiguous axial images were obtained from the base of the skull through the vertex without intravenous contrast.  COMPARISON:  None.  FINDINGS: Ventricles are normal in size and configuration. No parenchymal masses or mass effect. No evidence of an infarct. No extra-axial masses or abnormal fluid collections.  No intracranial hemorrhage.  Visualized sinuses and mastoid air cells are clear. No skull lesion.  IMPRESSION: Normal unenhanced CT scan the brain.   Electronically Signed   By: Lajean Manes M.D.   On: 10/21/2014 23:41   Ct Angio Chest Aorta W/cm &/or Wo/cm  10/21/2014   CLINICAL DATA:  Chest pain  EXAM: CT  ANGIOGRAPHY CHEST, ABDOMEN AND PELVIS  TECHNIQUE: Multidetector CT imaging through the chest, abdomen and pelvis was performed using the standard protocol during bolus administration of intravenous contrast. Multiplanar reconstructed images and MIPs were obtained and reviewed to evaluate the vascular anatomy.  CONTRAST:  149mL OMNIPAQUE IOHEXOL 350 MG/ML SOLN  COMPARISON:  None.  FINDINGS: CTA CHEST FINDINGS  THORACIC INLET/BODY WALL:  Thyroidectomy and lower right neck dissection. Indeterminate tissue medial to the left common carotid artery, without definitive adenopathy. Given pulmonary findings, the will likely be updated dedicated staging.  MEDIASTINUM:  Normal heart size. No pericardial effusion. No acute vascular abnormality, including central pulmonary embolism or aortic dissection. No aortic aneurysm or intramural hematoma. No adenopathy.  LUNG WINDOWS:  There are innumerable rounded pulmonary nodules throughout bilateral lungs. Most of the nodules are less than 1cm, with the largest in the left lower lobe on image 34 measuring 12 mm. In the bilateral apical upper lobes, there is also elongated ill-defined opacities with a small area of cavitation on the right. This is presumably pneumonia; no chart indication of neck radiation.  OSSEOUS:  No acute fracture.  No suspicious lytic or blastic lesions.  Review of the MIP images confirms the above findings.  CTA ABDOMEN AND PELVIS FINDINGS  BODY WALL: Unremarkable.  Liver: No focal abnormality.  Biliary: No evidence of biliary obstruction or stone.  Pancreas: Unremarkable.  Spleen: Unremarkable.  Adrenals: Unremarkable.  Kidneys and ureters: No hydronephrosis or stone.  Bladder: Unremarkable.  Reproductive: Unremarkable.  Bowel: No obstruction. No inflammatory bowel wall thickening.  Retroperitoneum: Prominent lymph nodes along the greater curvature of the stomach and in the gastrohepatic ligament, measuring approximately 1 cm in short axis. No inflammatory or  neoplastic appearing gastric wall thickening.  Peritoneum: No ascites or pneumoperitoneum.  Vascular: Standard aortic branching. No significant atherosclerotic change. No aneurysm, dissection, or stenosis.  OSSEOUS: No acute abnormalities.  Review of the MIP images confirms the above findings.  IMPRESSION: 1. Negative aorta. 2. Biapical airspace disease with early cavitation, presumably pneumonia. 3. Innumerable pulmonary nodules, likely metastases from the patient's treated thyroid cancer. 4. Mild perigastric adenopathy. Consider surveillance imaging or endoscopy.   Electronically Signed   By: Jorje Guild M.D.   On: 10/21/2014 23:53   Ct Angio Abd/pel W/ And/or W/o  10/21/2014   CLINICAL DATA:  Chest pain  EXAM: CT ANGIOGRAPHY CHEST, ABDOMEN AND PELVIS  TECHNIQUE: Multidetector CT imaging through the chest, abdomen and pelvis was performed using the standard protocol during bolus administration of intravenous contrast. Multiplanar reconstructed images and MIPs were obtained and reviewed to evaluate the vascular anatomy.  CONTRAST:  111mL OMNIPAQUE IOHEXOL 350 MG/ML SOLN  COMPARISON:  None.  FINDINGS: CTA CHEST FINDINGS  THORACIC INLET/BODY WALL:  Thyroidectomy and lower right neck dissection. Indeterminate tissue medial to the left common carotid artery, without definitive adenopathy. Given pulmonary findings, the will likely be updated dedicated staging.  MEDIASTINUM:  Normal heart size. No pericardial effusion. No acute vascular abnormality, including central pulmonary embolism or aortic dissection. No aortic aneurysm or intramural hematoma. No adenopathy.  LUNG WINDOWS:  There are innumerable rounded pulmonary nodules throughout bilateral lungs. Most of the nodules are less than 1cm, with the largest in the left lower lobe on image 34 measuring 12 mm. In the bilateral apical upper lobes, there is also elongated ill-defined opacities with a small area of cavitation on the right. This is presumably  pneumonia; no chart indication of neck radiation.  OSSEOUS:  No acute fracture.  No suspicious lytic or blastic lesions.  Review of the MIP images confirms the above findings.  CTA ABDOMEN AND PELVIS FINDINGS  BODY WALL: Unremarkable.  Liver: No focal abnormality.  Biliary: No evidence of biliary obstruction or stone.  Pancreas: Unremarkable.  Spleen: Unremarkable.  Adrenals: Unremarkable.  Kidneys and ureters: No hydronephrosis or stone.  Bladder: Unremarkable.  Reproductive: Unremarkable.  Bowel: No obstruction. No inflammatory bowel wall thickening.  Retroperitoneum: Prominent lymph nodes along the greater curvature of the stomach and in the gastrohepatic ligament, measuring approximately 1 cm in short axis. No inflammatory or neoplastic appearing gastric wall thickening.  Peritoneum: No ascites or pneumoperitoneum.  Vascular: Standard aortic branching. No significant atherosclerotic change. No aneurysm, dissection, or stenosis.  OSSEOUS: No acute abnormalities.  Review of the MIP images confirms the above findings.  IMPRESSION: 1. Negative aorta. 2. Biapical airspace disease with early cavitation, presumably pneumonia. 3. Innumerable pulmonary nodules, likely metastases from the patient's treated thyroid cancer. 4. Mild perigastric adenopathy. Consider surveillance imaging or endoscopy.   Electronically Signed   By: Jorje Guild M.D.   On: 10/21/2014 23:53    PHYSICAL EXAM General: NAD Neck: No JVD, no thyromegaly or thyroid nodule.  Lungs: Mild crackles left upper lung field CV: Nondisplaced PMI.  Heart regular S1/S2, no S3/S4, no murmur.  No peripheral edema.  No carotid bruit.  Normal pedal pulses.  Abdomen: Soft, nontender, no hepatosplenomegaly, no distention.  Neurologic: Alert and oriented x 3.  Psych: Normal affect. Extremities: No clubbing or cyanosis.   TELEMETRY: Reviewed telemetry pt in [facility]  ASSESSMENT AND PLAN: 49 yo with HTN, diabetes and metastatic thyroid cancer presented with  chest pain and was found to have mildly elevated troponin.  She also was found to have bilateral upper lung airspace disease with some cavitation on the right.  1. Elevated troponin: Patient's chest pain is now resolved.  It was pleuritic in nature.  Troponin increased to 0.1 and she had inferior TWIs (no prior). CTA chest was negative for PE or dissection.  It also did not show coronary artery calcium.  She does have risk factors for CAD: HTN and DM.  No definite event has occurred that could cause demand ischemia (no tachycardia, hypotension, etc).  Cannot rule out Takotsubo-type event, but I do think that she will need further workup. - Agree with ASA 81, statin, heparin gtt, metoprolol for now. - Still awaiting echo.  - Reviewed CT with radiology, doubt active infection.  Plan cardiac cath today.  2. Lung lesions: Patient has bilateral innumerable lung nodules from known metastastic thyroid cancer.  She also has bilateral  apical airspace disease with some cavitation on the right.  Last CT from [facility] in 12/15 showed bilateral apical ground glass.  Reviewed with radiology, doubt active infection.  Findings likely related to her metastatic thyroid cancer.   3. Metastatic thyroid cancer: Followed and treated at [facility].  Known metastatic disease.  I am unsure about her long-term prognosis based on review of [facility] notes.    [name] 10/23/2014 7:26 AM

## 2014-10-23 NOTE — CV Procedure (Signed)
    Cardiac Catheterization Procedure Note  Name: Jenny [name] MRN: [id number] DOB: [birth year]  Procedure: Left Heart Cath, Selective Coronary Angiography, LV angiography  Indication: NSTEMI   Procedural Details: The right wrist was prepped, draped, and anesthetized with 1% lidocaine. Using the modified Seldinger technique, a 6 French Slender sheath was introduced into the right radial artery. 3 mg of verapamil was administered through the sheath, weight-based unfractionated heparin was administered intravenously. Standard Judkins catheters were used for selective coronary angiography and left ventriculography. Catheter exchanges were performed over an exchange length guidewire. There were no immediate procedural complications. A TR band was used for radial hemostasis at the completion of the procedure.  The patient was transferred to the post catheterization recovery area for further monitoring.  Procedural Findings: Hemodynamics: AO 138/87 LV 140/6  Coronary angiography: Coronary dominance: right  Left mainstem: Short, no significant disease.   Left anterior descending (LAD): No angiographic coronary disease.   Left circumflex (LCx): No angiographic coronary disease.   Right coronary artery (RCA): No angiographic coronary disease.   Left ventriculography: Left ventricular systolic function is normal, LVEF is estimated at 60-65%, there is mild mitral regurgitation.   Final Conclusions:  No angiographic coronary artery disease.  She did have some ST depression with contrast injection.  Possible small vessel disease.  Will get echo but did not see wall motion abnormalities on LV-gram that would suggest Takotsubo cardiomyopathy.   [name] MD, [facility] 10/23/2014, 11:41 AM

## 2014-10-23 NOTE — Progress Notes (Addendum)
MD notified of bs 449. Sherrie Mustache 4:47 PM    Dr. Coralyn Pear to place orders, acknowledged.  4:52 PM

## 2014-10-23 NOTE — Progress Notes (Signed)
Patient ID: Jenny [name], female   DOB: 04-17-1966, 49 y.o.   MRN: [id number]   SUBJECTIVE: No further chest pain.  Troponin trended down to 0.4.  No echo done yet.  No complaints this morning.   Scheduled Meds: . aspirin  81 mg Oral Daily  . aspirin  81 mg Oral Pre-Cath  . atorvastatin  40 mg Oral q1800  . ceFEPime (MAXIPIME) IV  1 g Intravenous 3 times per day  . insulin aspart  0-9 Units Subcutaneous TID WC  . irbesartan  150 mg Oral Daily  . levothyroxine  200 mcg Oral QAC breakfast  . metoprolol tartrate  12.5 mg Oral BID  . senna  1 tablet Oral BID  . sodium chloride  3 mL Intravenous Q12H  . sodium chloride  3 mL Intravenous Q12H  . vancomycin  1,000 mg Intravenous Q12H   Continuous Infusions: . sodium chloride 100 mL/hr at 10/23/14 0033  . [START ON 10/24/2014] sodium chloride    . heparin 1,100 Units/hr (10/23/14 0033)   PRN Meds:.sodium chloride, acetaminophen **OR** acetaminophen, morphine injection, ondansetron **OR** ondansetron (ZOFRAN) IV, polyethylene glycol, sodium chloride    Filed Vitals:   10/22/14 1800 10/22/14 1900 10/23/14 0000 10/23/14 0424  BP: 160/73 161/67 163/83 157/88  Pulse:  68 61 58  Temp:  98.5 F (36.9 C) 98.1 F (36.7 C) 97.6 F (36.4 C)  TempSrc:  Oral Oral Oral  Resp: 20 20 21 19   Height:      Weight:      SpO2:  97% 98% 98%    Intake/Output Summary (Last 24 hours) at 10/23/14 0726 Last data filed at 10/22/14 1750  Gross per 24 hour  Intake    518 ml  Output   1000 ml  Net   -482 ml    LABS: Basic Metabolic Panel:  Recent Labs  10/22/14 0200 10/23/14 0130  NA 138 138  K 3.1* 3.6  CL 102 102  CO2 27 27  GLUCOSE 326* 263*  BUN 5* <5*  CREATININE 0.40* 0.39*  CALCIUM 8.8 8.8   Liver Function Tests:  Recent Labs  10/22/14 0200  AST 23  ALT 24  ALKPHOS 133*  BILITOT 0.7  PROT 6.6  ALBUMIN 3.7   No results for input(s): LIPASE, AMYLASE in the last 72 hours. CBC:  Recent Labs  10/22/14 0200 10/23/14 0130    WBC 6.7 5.3  HGB 13.8 13.6  HCT 38.5 37.9  MCV 84.2 83.8  PLT 182 169   Cardiac Enzymes:  Recent Labs  10/22/14 1315 10/22/14 1900 10/23/14 0130  TROPONINI 0.06* 0.04* 0.04*   BNP: Invalid input(s): POCBNP D-Dimer: No results for input(s): DDIMER in the last 72 hours. Hemoglobin A1C:  Recent Labs  10/22/14 0030  HGBA1C 12.9*   Fasting Lipid Panel: No results for input(s): CHOL, HDL, LDLCALC, TRIG, CHOLHDL, LDLDIRECT in the last 72 hours. Thyroid Function Tests: No results for input(s): TSH, T4TOTAL, T3FREE, THYROIDAB in the last 72 hours.  Invalid input(s): FREET3 Anemia Panel: No results for input(s): VITAMINB12, FOLATE, FERRITIN, TIBC, IRON, RETICCTPCT in the last 72 hours.  RADIOLOGY: Dg Chest 2 View  10/21/2014   CLINICAL DATA:  Chest, arm and jaw pain for 2 days.  EXAM: CHEST  2 VIEW  COMPARISON:  None.  FINDINGS: Cardiac silhouette normal in size and configuration. Normal mediastinal and hilar contours.  Clear lungs.  No pleural effusion or pneumothorax.  Surgical clips noted at the right neck base.  Bony thorax is unremarkable.  IMPRESSION: No active cardiopulmonary disease.   Electronically Signed   By: Lajean Manes M.D.   On: 10/21/2014 22:14   Ct Head Wo Contrast  10/21/2014   CLINICAL DATA:  Headache.  EXAM: CT HEAD WITHOUT CONTRAST  TECHNIQUE: Contiguous axial images were obtained from the base of the skull through the vertex without intravenous contrast.  COMPARISON:  None.  FINDINGS: Ventricles are normal in size and configuration. No parenchymal masses or mass effect. No evidence of an infarct. No extra-axial masses or abnormal fluid collections.  No intracranial hemorrhage.  Visualized sinuses and mastoid air cells are clear. No skull lesion.  IMPRESSION: Normal unenhanced CT scan the brain.   Electronically Signed   By: Lajean Manes M.D.   On: 10/21/2014 23:41   Ct Angio Chest Aorta W/cm &/or Wo/cm  10/21/2014   CLINICAL DATA:  Chest pain  EXAM: CT  ANGIOGRAPHY CHEST, ABDOMEN AND PELVIS  TECHNIQUE: Multidetector CT imaging through the chest, abdomen and pelvis was performed using the standard protocol during bolus administration of intravenous contrast. Multiplanar reconstructed images and MIPs were obtained and reviewed to evaluate the vascular anatomy.  CONTRAST:  157mL OMNIPAQUE IOHEXOL 350 MG/ML SOLN  COMPARISON:  None.  FINDINGS: CTA CHEST FINDINGS  THORACIC INLET/BODY WALL:  Thyroidectomy and lower right neck dissection. Indeterminate tissue medial to the left common carotid artery, without definitive adenopathy. Given pulmonary findings, the will likely be updated dedicated staging.  MEDIASTINUM:  Normal heart size. No pericardial effusion. No acute vascular abnormality, including central pulmonary embolism or aortic dissection. No aortic aneurysm or intramural hematoma. No adenopathy.  LUNG WINDOWS:  There are innumerable rounded pulmonary nodules throughout bilateral lungs. Most of the nodules are less than 1cm, with the largest in the left lower lobe on image 34 measuring 12 mm. In the bilateral apical upper lobes, there is also elongated ill-defined opacities with a small area of cavitation on the right. This is presumably pneumonia; no chart indication of neck radiation.  OSSEOUS:  No acute fracture.  No suspicious lytic or blastic lesions.  Review of the MIP images confirms the above findings.  CTA ABDOMEN AND PELVIS FINDINGS  BODY WALL: Unremarkable.  Liver: No focal abnormality.  Biliary: No evidence of biliary obstruction or stone.  Pancreas: Unremarkable.  Spleen: Unremarkable.  Adrenals: Unremarkable.  Kidneys and ureters: No hydronephrosis or stone.  Bladder: Unremarkable.  Reproductive: Unremarkable.  Bowel: No obstruction. No inflammatory bowel wall thickening.  Retroperitoneum: Prominent lymph nodes along the greater curvature of the stomach and in the gastrohepatic ligament, measuring approximately 1 cm in short axis. No inflammatory or  neoplastic appearing gastric wall thickening.  Peritoneum: No ascites or pneumoperitoneum.  Vascular: Standard aortic branching. No significant atherosclerotic change. No aneurysm, dissection, or stenosis.  OSSEOUS: No acute abnormalities.  Review of the MIP images confirms the above findings.  IMPRESSION: 1. Negative aorta. 2. Biapical airspace disease with early cavitation, presumably pneumonia. 3. Innumerable pulmonary nodules, likely metastases from the patient's treated thyroid cancer. 4. Mild perigastric adenopathy. Consider surveillance imaging or endoscopy.   Electronically Signed   By: Jorje Guild M.D.   On: 10/21/2014 23:53   Ct Angio Abd/pel W/ And/or W/o  10/21/2014   CLINICAL DATA:  Chest pain  EXAM: CT ANGIOGRAPHY CHEST, ABDOMEN AND PELVIS  TECHNIQUE: Multidetector CT imaging through the chest, abdomen and pelvis was performed using the standard protocol during bolus administration of intravenous contrast. Multiplanar reconstructed images and MIPs were obtained and reviewed to evaluate the vascular anatomy.  CONTRAST:  159mL OMNIPAQUE IOHEXOL 350 MG/ML SOLN  COMPARISON:  None.  FINDINGS: CTA CHEST FINDINGS  THORACIC INLET/BODY WALL:  Thyroidectomy and lower right neck dissection. Indeterminate tissue medial to the left common carotid artery, without definitive adenopathy. Given pulmonary findings, the will likely be updated dedicated staging.  MEDIASTINUM:  Normal heart size. No pericardial effusion. No acute vascular abnormality, including central pulmonary embolism or aortic dissection. No aortic aneurysm or intramural hematoma. No adenopathy.  LUNG WINDOWS:  There are innumerable rounded pulmonary nodules throughout bilateral lungs. Most of the nodules are less than 1cm, with the largest in the left lower lobe on image 34 measuring 12 mm. In the bilateral apical upper lobes, there is also elongated ill-defined opacities with a small area of cavitation on the right. This is presumably  pneumonia; no chart indication of neck radiation.  OSSEOUS:  No acute fracture.  No suspicious lytic or blastic lesions.  Review of the MIP images confirms the above findings.  CTA ABDOMEN AND PELVIS FINDINGS  BODY WALL: Unremarkable.  Liver: No focal abnormality.  Biliary: No evidence of biliary obstruction or stone.  Pancreas: Unremarkable.  Spleen: Unremarkable.  Adrenals: Unremarkable.  Kidneys and ureters: No hydronephrosis or stone.  Bladder: Unremarkable.  Reproductive: Unremarkable.  Bowel: No obstruction. No inflammatory bowel wall thickening.  Retroperitoneum: Prominent lymph nodes along the greater curvature of the stomach and in the gastrohepatic ligament, measuring approximately 1 cm in short axis. No inflammatory or neoplastic appearing gastric wall thickening.  Peritoneum: No ascites or pneumoperitoneum.  Vascular: Standard aortic branching. No significant atherosclerotic change. No aneurysm, dissection, or stenosis.  OSSEOUS: No acute abnormalities.  Review of the MIP images confirms the above findings.  IMPRESSION: 1. Negative aorta. 2. Biapical airspace disease with early cavitation, presumably pneumonia. 3. Innumerable pulmonary nodules, likely metastases from the patient's treated thyroid cancer. 4. Mild perigastric adenopathy. Consider surveillance imaging or endoscopy.   Electronically Signed   By: Jorje Guild M.D.   On: 10/21/2014 23:53    PHYSICAL EXAM General: NAD Neck: No JVD, no thyromegaly or thyroid nodule.  Lungs: Mild crackles left upper lung field CV: Nondisplaced PMI.  Heart regular S1/S2, no S3/S4, no murmur.  No peripheral edema.  No carotid bruit.  Normal pedal pulses.  Abdomen: Soft, nontender, no hepatosplenomegaly, no distention.  Neurologic: Alert and oriented x 3.  Psych: Normal affect. Extremities: No clubbing or cyanosis.   TELEMETRY: Reviewed telemetry pt in [facility]  ASSESSMENT AND PLAN: 49 yo with HTN, diabetes and metastatic thyroid cancer presented with  chest pain and was found to have mildly elevated troponin.  She also was found to have bilateral upper lung airspace disease with some cavitation on the right.  1. Elevated troponin: Patient's chest pain is now resolved.  It was pleuritic in nature.  Troponin increased to 0.1 and she had inferior TWIs (no prior). CTA chest was negative for PE or dissection.  It also did not show coronary artery calcium.  She does have risk factors for CAD: HTN and DM.  No definite event has occurred that could cause demand ischemia (no tachycardia, hypotension, etc).  Cannot rule out Takotsubo-type event, but I do think that she will need further workup. - Agree with ASA 81, statin, heparin gtt, metoprolol for now. - Still awaiting echo.  - Reviewed CT with radiology, doubt active infection.  Plan cardiac cath today.  2. Lung lesions: Patient has bilateral innumerable lung nodules from known metastastic thyroid cancer.  She also has bilateral  apical airspace disease with some cavitation on the right.  Last CT from [facility] in 12/15 showed bilateral apical ground glass.  Reviewed with radiology, doubt active infection.  Findings likely related to her metastatic thyroid cancer.   3. Metastatic thyroid cancer: Followed and treated at [facility].  Known metastatic disease.  I am unsure about her long-term prognosis based on review of [facility] notes.    [name] 10/23/2014 7:26 AM

## 2014-10-23 NOTE — Clinical Documentation Improvement (Signed)
--   Current documentation indicates "chest pain" is the Primary Diagnosis. In the Coding world this term is considered nonspecific and low weighted.   -- Per Cardiac Consult 1/19 "Chest pain, dynamic EKG, mildly elevated troponin --> consistent with NSTEMI".   -- If you agree with this opinion please add to documentation.  Thank you,  Ezekiel Ina ,RN Clinical Documentation Specialist:  [phone]  [place] [employer]

## 2014-10-23 NOTE — Progress Notes (Signed)
Dr. Coralyn Pear want to give additional 15 units Novolog now per SS on top of 20 uints given a hour ago , wants to continue to monitor pt. Jenny [name] 6:07 PM

## 2014-10-24 ENCOUNTER — Inpatient Hospital Stay (HOSPITAL_COMMUNITY): Payer: Medicaid Other

## 2014-10-24 DIAGNOSIS — R7989 Other specified abnormal findings of blood chemistry: Secondary | ICD-10-CM

## 2014-10-24 DIAGNOSIS — R071 Chest pain on breathing: Secondary | ICD-10-CM

## 2014-10-24 DIAGNOSIS — R072 Precordial pain: Secondary | ICD-10-CM

## 2014-10-24 LAB — PROCALCITONIN

## 2014-10-24 LAB — QUANTIFERON IN TUBE
QFT TB AG MINUS NIL VALUE: 0.46 IU/mL
QUANTIFERON MITOGEN VALUE: 1.87 [IU]/mL
QUANTIFERON NIL VALUE: 0.05 [IU]/mL
QUANTIFERON TB AG VALUE: 0.51 [IU]/mL
QUANTIFERON TB GOLD: POSITIVE — AB

## 2014-10-24 LAB — GLUCOSE, CAPILLARY
GLUCOSE-CAPILLARY: 178 mg/dL — AB (ref 70–99)
Glucose-Capillary: 263 mg/dL — ABNORMAL HIGH (ref 70–99)

## 2014-10-24 LAB — BASIC METABOLIC PANEL
Anion gap: 7 (ref 5–15)
BUN: 6 mg/dL (ref 6–23)
CALCIUM: 8.9 mg/dL (ref 8.4–10.5)
CHLORIDE: 105 meq/L (ref 96–112)
CO2: 27 mmol/L (ref 19–32)
Creatinine, Ser: 0.4 mg/dL — ABNORMAL LOW (ref 0.50–1.10)
GFR calc Af Amer: >90 mL/min (ref >90)
GLUCOSE: 155 mg/dL — AB (ref 70–99)
Potassium: 3.4 mmol/L — ABNORMAL LOW (ref 3.5–5.1)
SODIUM: 139 mmol/L (ref 135–145)

## 2014-10-24 LAB — TROPONIN I
Troponin I: 0.03 ng/mL (ref <0.031)
Troponin I: <0.03 ng/mL (ref <0.031)

## 2014-10-24 LAB — CBC
HEMATOCRIT: 37.3 % (ref 36.0–46.0)
HEMOGLOBIN: 13.3 g/dL (ref 12.0–15.0)
MCH: 30.9 pg (ref 26.0–34.0)
MCHC: 35.7 g/dL (ref 30.0–36.0)
MCV: 86.5 fL (ref 78.0–100.0)
PLATELETS: 159 10*3/uL (ref 150–400)
RBC: 4.31 MIL/uL (ref 3.87–5.11)
RDW: 11.7 % (ref 11.5–15.5)
WBC: 6.2 10*3/uL (ref 4.0–10.5)

## 2014-10-24 LAB — QUANTIFERON TB GOLD ASSAY (BLOOD)

## 2014-10-24 MED ORDER — OLMESARTAN MEDOXOMIL 20 MG PO TABS: 20.0000 mg | ORAL_TABLET | Freq: Every day | ORAL | Status: DC

## 2014-10-24 MED ORDER — LIVING WELL WITH DIABETES BOOK - IN SPANISH
Freq: Once | Status: AC
  Administered 2014-10-24: 12:00:00
  Filled 2014-10-24: qty 1

## 2014-10-24 MED ORDER — METFORMIN HCL 1000 MG PO TABS: 1000.0000 mg | ORAL_TABLET | Freq: Two times a day (BID) | ORAL | Status: DC

## 2014-10-24 MED ORDER — ASPIRIN 81 MG PO CHEW: 81.0000 mg | CHEWABLE_TABLET | Freq: Every day | ORAL | Status: DC

## 2014-10-24 MED ORDER — INSULIN STARTER KIT- SYRINGES (ENGLISH): 1.0000 | Freq: Once | Status: DC

## 2014-10-24 MED ORDER — ISOSORBIDE MONONITRATE ER 30 MG PO TB24: 30.0000 mg | ORAL_TABLET | Freq: Every day | ORAL | Status: DC

## 2014-10-24 MED ORDER — INSULIN NPH ISOPHANE & REGULAR (70-30) 100 UNIT/ML ~~LOC~~ SUSP: 10.0000 [IU] | Freq: Two times a day (BID) | SUBCUTANEOUS | Status: DC

## 2014-10-24 MED ORDER — INSULIN NPH (HUMAN) (ISOPHANE) 100 UNIT/ML ~~LOC~~ SUSP: 10.0000 [IU] | Freq: Two times a day (BID) | SUBCUTANEOUS | Status: DC

## 2014-10-24 MED ORDER — INSULIN GLARGINE 100 UNIT/ML ~~LOC~~ SOLN
12.0000 [IU] | Freq: Once | SUBCUTANEOUS | Status: DC
  Filled 2014-10-24: qty 0.12

## 2014-10-24 MED ORDER — "INSULIN SYRINGE 30G X 1/2"" 0.5 ML MISC": Status: DC

## 2014-10-24 MED ORDER — POTASSIUM CHLORIDE CRYS ER 20 MEQ PO TBCR
40.0000 meq | EXTENDED_RELEASE_TABLET | Freq: Once | ORAL | Status: AC
  Administered 2014-10-24: 40 meq via ORAL
  Filled 2014-10-24: qty 2

## 2014-10-24 MED ORDER — INSULIN STARTER KIT- SYRINGES (SPANISH)
1.0000 | Freq: Once | Status: AC
  Administered 2014-10-24: 1
  Filled 2014-10-24: qty 1

## 2014-10-24 MED ORDER — INSULIN GLARGINE 100 UNIT/ML ~~LOC~~ SOLN: 12.0000 [IU] | Freq: Every day | SUBCUTANEOUS | Status: DC

## 2014-10-24 NOTE — Discharge Summary (Addendum)
Physician Discharge Summary  Jenny [name] YHC:[id number] DOB: [birth year] DOA: 10/21/2014  PCP: Lorayne Marek, MD  Admit date: 10/21/2014 Discharge date: 10/24/2014  Time spent: 35 minutes  Recommendations for Outpatient Follow-up:  1. Please follow up on blood sugars, she was hyperglycemic during this hospitalizaiton 2. Follow up on blood pressures, imdur was added to her regimen due to hypertension 3. Patient with history of thyroid cancer, currently being managed at [facility], states having difficulties making appointments due to transportation issues, requesed CM consult to see if she can be set up to follow up at [facility]   Discharge Diagnoses:  Principal Problem:   Chest pain Active Problems:   Essential hypertension, benign   Thyroid cancer   Bilateral pneumonia   Metastatic disease   Type 2 diabetes mellitus without complication   Essential hypertension   Discharge Condition: Stable/Improved  Diet recommendation: Heart Healthy  [facility] Weights   10/21/14 2122  Weight: 63.504 kg (140 lb)    History of present illness:  CP. Started today around 19:30. Sudden onset. Associated w/ HA, SOB, and palpitations. Radiation to neck and L arm and back. Worse w/ deep breathing. Pain is getting worse. Denies fevers, n/v/,dysuria, frequency. H/o Thyroid cancer and is followed by [facility] Pt states she has metastatic disease to lungs and stomach armpits and throat. Last chemo and radiation 18 mo ago in [facility] Course:  Patient is a pleasant 49 year old female with a past medical history of diabetes, hypertension, thyroid cancer, admitted to the medicine service on 10/21/2014 when she presented with complaints of chest pain. She was found to have elevated troponin of 0.1. Chest x-ray did not reveal acute cardiopulmonary disease. Initially she was felt to have an NSTEMI for which she was started on anticoagulation with IV heparin. Cardiology was consulted as she was  taken to the Cath Lab on 10/23/2014. Left heart cath did not reveal angiographic coronary artery disease. Procedure was performed by Dr Aundra Dubin. I suspect CP secondary metastatic thyroid cancer with involvement to lungs. Her chest pain resolving during this hospitalization.  She was discharged home in stable condition on 10/24/2014.   Procedures:  Cardiac Catheterization. Performed on 10/23/2014 did not reveal angiographic coronary artery disease  Consultations:  Cardiology  Discharge Exam: Filed Vitals:   10/24/14 0601  BP: 124/70  Pulse: 62  Temp: 98.2 F (36.8 C)  Resp: 18    General: Patient is awake and alert, states feeling better, denies CP or shortness of breath Cardiovascular: Regular rate and rhythm, normal S1S2, normal inspiratory effort Respiratory: Clear to auscultation bilaterally, normal inspiratory effort Extremities: No edema  Discharge Instructions   Discharge Instructions    Call MD for:  difficulty breathing, headache or visual disturbances    Complete by:  As directed      Call MD for:  extreme fatigue    Complete by:  As directed      Call MD for:  hives    Complete by:  As directed      Call MD for:  persistant dizziness or light-headedness    Complete by:  As directed      Call MD for:  persistant nausea and vomiting    Complete by:  As directed      Call MD for:  redness, tenderness, or signs of infection (pain, swelling, redness, odor or green/yellow discharge around incision site)    Complete by:  As directed      Call MD for:  severe  uncontrolled pain    Complete by:  As directed      Call MD for:  temperature >100.4    Complete by:  As directed      Diet - low sodium heart healthy    Complete by:  As directed      Increase activity slowly    Complete by:  As directed           Current Discharge Medication List    START taking these medications   Details  aspirin 81 MG chewable tablet Chew 1 tablet (81 mg total) by mouth daily. Qty: 30  tablet, Refills: 1    insulin NPH Human (HUMULIN N) 100 UNIT/ML injection Inject 0.1 mLs (10 Units total) into the skin 2 (two) times daily before a meal. Qty: 10 mL, Refills: 11    isosorbide mononitrate (IMDUR) 30 MG 24 hr tablet Take 1 tablet (30 mg total) by mouth daily. Qty: 30 tablet, Refills: 2      CONTINUE these medications which have CHANGED   Details  metFORMIN (GLUCOPHAGE) 1000 MG tablet Take 1 tablet (1,000 mg total) by mouth 2 (two) times daily with a meal. Qty: 60 tablet, Refills: 3   Associated Diagnoses: Diabetes mellitus due to underlying condition without complications    olmesartan (BENICAR) 20 MG tablet Take 1 tablet (20 mg total) by mouth daily. Qty: 30 tablet, Refills: 3   Associated Diagnoses: Essential hypertension, benign      CONTINUE these medications which have NOT CHANGED   Details  levothyroxine (SYNTHROID, LEVOTHROID) 200 MCG tablet Take 1 tablet (200 mcg total) by mouth daily before breakfast. Qty: 30 tablet, Refills: 3   Associated Diagnoses: Unspecified hypothyroidism      STOP taking these medications     cyclobenzaprine (FLEXERIL) 10 MG tablet      fluconazole (DIFLUCAN) 150 MG tablet      metroNIDAZOLE (FLAGYL) 500 MG tablet        No Known Allergies Follow-up Information    Follow up with Lorayne Marek, MD In 1 week.   Specialty:  Internal Medicine   Contact information:   [place]  [id number] [phone]       Follow up with [facility] In 2 weeks.       The results of significant diagnostics from this hospitalization (including imaging, microbiology, ancillary and laboratory) are listed below for reference.    Significant Diagnostic Studies: Dg Chest 2 View  10/24/2014   CLINICAL DATA:  Pneumonia.  History of thyroid cancer.  EXAM: CHEST  2 VIEW  COMPARISON:  Chest radiograph 10/21/2014 and CT 10/21/2014  FINDINGS: There are vague densities throughout the right upper lung consistent  with irregular nodular densities from the recent CT. These densities are poorly characterized on radiography. Heart size is normal. Trachea is midline. No evidence for pleural effusions.  IMPRESSION: Vague irregular nodular densities in the chest, particularly in the right upper lung. Findings correspond with the lesions on recent CT. Findings are concerning for metastatic disease but there may also be infection. Minimal change from the recent chest radiograph.   Electronically Signed   By: Markus Daft M.D.   On: 10/24/2014 08:05   Dg Chest 2 View  10/21/2014   CLINICAL DATA:  Chest, arm and jaw pain for 2 days.  EXAM: CHEST  2 VIEW  COMPARISON:  None.  FINDINGS: Cardiac silhouette normal in size and configuration. Normal mediastinal and hilar contours.  Clear lungs.  No  pleural effusion or pneumothorax.  Surgical clips noted at the right neck base.  Bony thorax is unremarkable.  IMPRESSION: No active cardiopulmonary disease.   Electronically Signed   By: Lajean Manes M.D.   On: 10/21/2014 22:14   Ct Head Wo Contrast  10/21/2014   CLINICAL DATA:  Headache.  EXAM: CT HEAD WITHOUT CONTRAST  TECHNIQUE: Contiguous axial images were obtained from the base of the skull through the vertex without intravenous contrast.  COMPARISON:  None.  FINDINGS: Ventricles are normal in size and configuration. No parenchymal masses or mass effect. No evidence of an infarct. No extra-axial masses or abnormal fluid collections.  No intracranial hemorrhage.  Visualized sinuses and mastoid air cells are clear. No skull lesion.  IMPRESSION: Normal unenhanced CT scan the brain.   Electronically Signed   By: Lajean Manes M.D.   On: 10/21/2014 23:41   Ct Angio Chest Aorta W/cm &/or Wo/cm  10/21/2014   CLINICAL DATA:  Chest pain  EXAM: CT ANGIOGRAPHY CHEST, ABDOMEN AND PELVIS  TECHNIQUE: Multidetector CT imaging through the chest, abdomen and pelvis was performed using the standard protocol during bolus administration of intravenous  contrast. Multiplanar reconstructed images and MIPs were obtained and reviewed to evaluate the vascular anatomy.  CONTRAST:  134mL OMNIPAQUE IOHEXOL 350 MG/ML SOLN  COMPARISON:  None.  FINDINGS: CTA CHEST FINDINGS  THORACIC INLET/BODY WALL:  Thyroidectomy and lower right neck dissection. Indeterminate tissue medial to the left common carotid artery, without definitive adenopathy. Given pulmonary findings, the will likely be updated dedicated staging.  MEDIASTINUM:  Normal heart size. No pericardial effusion. No acute vascular abnormality, including central pulmonary embolism or aortic dissection. No aortic aneurysm or intramural hematoma. No adenopathy.  LUNG WINDOWS:  There are innumerable rounded pulmonary nodules throughout bilateral lungs. Most of the nodules are less than 1cm, with the largest in the left lower lobe on image 34 measuring 12 mm. In the bilateral apical upper lobes, there is also elongated ill-defined opacities with a small area of cavitation on the right. This is presumably pneumonia; no chart indication of neck radiation.  OSSEOUS:  No acute fracture.  No suspicious lytic or blastic lesions.  Review of the MIP images confirms the above findings.  CTA ABDOMEN AND PELVIS FINDINGS  BODY WALL: Unremarkable.  Liver: No focal abnormality.  Biliary: No evidence of biliary obstruction or stone.  Pancreas: Unremarkable.  Spleen: Unremarkable.  Adrenals: Unremarkable.  Kidneys and ureters: No hydronephrosis or stone.  Bladder: Unremarkable.  Reproductive: Unremarkable.  Bowel: No obstruction. No inflammatory bowel wall thickening.  Retroperitoneum: Prominent lymph nodes along the greater curvature of the stomach and in the gastrohepatic ligament, measuring approximately 1 cm in short axis. No inflammatory or neoplastic appearing gastric wall thickening.  Peritoneum: No ascites or pneumoperitoneum.  Vascular: Standard aortic branching. No significant atherosclerotic change. No aneurysm, dissection, or  stenosis.  OSSEOUS: No acute abnormalities.  Review of the MIP images confirms the above findings.  IMPRESSION: 1. Negative aorta. 2. Biapical airspace disease with early cavitation, presumably pneumonia. 3. Innumerable pulmonary nodules, likely metastases from the patient's treated thyroid cancer. 4. Mild perigastric adenopathy. Consider surveillance imaging or endoscopy.   Electronically Signed   By: Jorje Guild M.D.   On: 10/21/2014 23:53   Ct Angio Abd/pel W/ And/or W/o  10/21/2014   CLINICAL DATA:  Chest pain  EXAM: CT ANGIOGRAPHY CHEST, ABDOMEN AND PELVIS  TECHNIQUE: Multidetector CT imaging through the chest, abdomen and pelvis was performed using the standard protocol during  bolus administration of intravenous contrast. Multiplanar reconstructed images and MIPs were obtained and reviewed to evaluate the vascular anatomy.  CONTRAST:  167mL OMNIPAQUE IOHEXOL 350 MG/ML SOLN  COMPARISON:  None.  FINDINGS: CTA CHEST FINDINGS  THORACIC INLET/BODY WALL:  Thyroidectomy and lower right neck dissection. Indeterminate tissue medial to the left common carotid artery, without definitive adenopathy. Given pulmonary findings, the will likely be updated dedicated staging.  MEDIASTINUM:  Normal heart size. No pericardial effusion. No acute vascular abnormality, including central pulmonary embolism or aortic dissection. No aortic aneurysm or intramural hematoma. No adenopathy.  LUNG WINDOWS:  There are innumerable rounded pulmonary nodules throughout bilateral lungs. Most of the nodules are less than 1cm, with the largest in the left lower lobe on image 34 measuring 12 mm. In the bilateral apical upper lobes, there is also elongated ill-defined opacities with a small area of cavitation on the right. This is presumably pneumonia; no chart indication of neck radiation.  OSSEOUS:  No acute fracture.  No suspicious lytic or blastic lesions.  Review of the MIP images confirms the above findings.  CTA ABDOMEN AND PELVIS  FINDINGS  BODY WALL: Unremarkable.  Liver: No focal abnormality.  Biliary: No evidence of biliary obstruction or stone.  Pancreas: Unremarkable.  Spleen: Unremarkable.  Adrenals: Unremarkable.  Kidneys and ureters: No hydronephrosis or stone.  Bladder: Unremarkable.  Reproductive: Unremarkable.  Bowel: No obstruction. No inflammatory bowel wall thickening.  Retroperitoneum: Prominent lymph nodes along the greater curvature of the stomach and in the gastrohepatic ligament, measuring approximately 1 cm in short axis. No inflammatory or neoplastic appearing gastric wall thickening.  Peritoneum: No ascites or pneumoperitoneum.  Vascular: Standard aortic branching. No significant atherosclerotic change. No aneurysm, dissection, or stenosis.  OSSEOUS: No acute abnormalities.  Review of the MIP images confirms the above findings.  IMPRESSION: 1. Negative aorta. 2. Biapical airspace disease with early cavitation, presumably pneumonia. 3. Innumerable pulmonary nodules, likely metastases from the patient's treated thyroid cancer. 4. Mild perigastric adenopathy. Consider surveillance imaging or endoscopy.   Electronically Signed   By: Jorje Guild M.D.   On: 10/21/2014 23:53    Microbiology: Recent Results (from the past 240 hour(s))  Culture, blood (routine x 2)     Status: None (Preliminary result)   Collection Time: 10/22/14 12:30 AM  Result Value Ref Range Status   Specimen Description BLOOD RIGHT ANTECUBITAL  Final   Special Requests BOTTLES DRAWN AEROBIC AND ANAEROBIC 10CC EA  Final   Culture   Final           BLOOD CULTURE RECEIVED NO GROWTH TO DATE CULTURE WILL BE HELD FOR 5 DAYS BEFORE ISSUING A FINAL NEGATIVE REPORT Performed at [facility]    Report Status PENDING  Incomplete  Culture, blood (routine x 2)     Status: None (Preliminary result)   Collection Time: 10/22/14 12:38 AM  Result Value Ref Range Status   Specimen Description BLOOD RIGHT FOREARM  Final   Special Requests BOTTLES  DRAWN AEROBIC AND ANAEROBIC 10CC EA  Final   Culture   Final           BLOOD CULTURE RECEIVED NO GROWTH TO DATE CULTURE WILL BE HELD FOR 5 DAYS BEFORE ISSUING A FINAL NEGATIVE REPORT Performed at [facility]    Report Status PENDING  Incomplete  MRSA PCR Screening     Status: None   Collection Time: 10/22/14  1:29 AM  Result Value Ref Range Status   MRSA by PCR NEGATIVE  NEGATIVE Final    Comment:        The GeneXpert MRSA Assay (FDA approved for NASAL specimens only), is one component of a comprehensive MRSA colonization surveillance program. It is not intended to diagnose MRSA infection nor to guide or monitor treatment for MRSA infections.      Labs: Basic Metabolic Panel:  Recent Labs Lab 10/21/14 2128 10/22/14 0200 10/23/14 0130 10/23/14 2102 10/24/14 0415  NA 132* 138 138  --  139  K 4.0 3.1* 3.6  --  3.4*  CL 93* 102 102  --  105  CO2 30 27 27   --  27  GLUCOSE 550* 326* 263* 100* 155*  BUN 10 5* <5*  --  6  CREATININE 0.59 0.40* 0.39*  --  0.40*  CALCIUM 9.4 8.8 8.8  --  8.9   Liver Function Tests:  Recent Labs Lab 10/22/14 0200  AST 23  ALT 24  ALKPHOS 133*  BILITOT 0.7  PROT 6.6  ALBUMIN 3.7   No results for input(s): LIPASE, AMYLASE in the last 168 hours. No results for input(s): AMMONIA in the last 168 hours. CBC:  Recent Labs Lab 10/21/14 2128 10/22/14 0200 10/23/14 0130 10/24/14 0415  WBC 6.3 6.7 5.3 6.2  HGB 15.3* 13.8 13.6 13.3  HCT 41.6 38.5 37.9 37.3  MCV 84.9 84.2 83.8 86.5  PLT 188 182 169 159   Cardiac Enzymes:  Recent Labs Lab 10/23/14 0810 10/23/14 1402 10/23/14 2102 10/24/14 0414 10/24/14 0810  TROPONINI 0.03 <0.03 <0.03 0.03 <0.03   BNP: BNP (last 3 results) No results for input(s): PROBNP in the last 8760 hours. CBG:  Recent Labs Lab 10/23/14 1150 10/23/14 1640 10/23/14 1758 10/23/14 2114 10/24/14 0600  GLUCAP 193* 449* 335* 113* 178*       Signed:  Delcastillo, Abdulloh Ullom  [facility] 10/24/2014, 11:18 AM

## 2014-10-24 NOTE — Progress Notes (Signed)
Pt discharged home Discharge instructions given & reviewed Education discussed, diabetes coordinator involved, interpreter utilized.  IV dc'd  Tele dc'd  Pt discharged via ambulation with family.  All pt belongs at side.  Kathleen Argue S 1:50 PM

## 2014-10-24 NOTE — Progress Notes (Signed)
SUBJECTIVE:  No pain.   PHYSICAL EXAM Filed Vitals:   10/23/14 1502 10/23/14 1602 10/23/14 2118 10/24/14 0601  BP: 149/102 118/70 118/77 124/70  Pulse:  72 88 62  Temp:  98.7 F (37.1 C) 97.8 F (36.6 C) 98.2 F (36.8 C)  TempSrc:  Oral Oral Oral  Resp:  18 18 18   Height:      Weight:      SpO2:  99% 99% 94%   General:  No distress Extremities:  Right wrist OK post cath.   LABS: Lab Results  Component Value Date   TROPONINI 0.03 10/24/2014   Results for orders placed or performed during the hospital encounter of 10/21/14 (from the past 24 hour(s))  Glucose, capillary     Status: Abnormal   Collection Time: 10/23/14  9:17 AM  Result Value Ref Range   Glucose-Capillary 248 (H) 70 - 99 mg/dL  Glucose, capillary     Status: Abnormal   Collection Time: 10/23/14 11:50 AM  Result Value Ref Range   Glucose-Capillary 193 (H) 70 - 99 mg/dL  Troponin I     Status: None   Collection Time: 10/23/14  2:02 PM  Result Value Ref Range   Troponin I <0.03 <0.031 ng/mL  Glucose, capillary     Status: Abnormal   Collection Time: 10/23/14  4:40 PM  Result Value Ref Range   Glucose-Capillary 449 (H) 70 - 99 mg/dL  Glucose, capillary     Status: Abnormal   Collection Time: 10/23/14  5:58 PM  Result Value Ref Range   Glucose-Capillary 335 (H) 70 - 99 mg/dL  Troponin I     Status: None   Collection Time: 10/23/14  9:02 PM  Result Value Ref Range   Troponin I <0.03 <0.031 ng/mL  Glucose, random     Status: Abnormal   Collection Time: 10/23/14  9:02 PM  Result Value Ref Range   Glucose, Bld 100 (H) 70 - 99 mg/dL  Glucose, capillary     Status: Abnormal   Collection Time: 10/23/14  9:14 PM  Result Value Ref Range   Glucose-Capillary 113 (H) 70 - 99 mg/dL   Comment 1 Documented in Chart    Comment 2 Notify RN   Troponin I     Status: None   Collection Time: 10/24/14  4:14 AM  Result Value Ref Range   Troponin I 0.03 <0.031 ng/mL  CBC     Status: None   Collection Time:  10/24/14  4:15 AM  Result Value Ref Range   WBC 6.2 4.0 - 10.5 K/uL   RBC 4.31 3.87 - 5.11 MIL/uL   Hemoglobin 13.3 12.0 - 15.0 g/dL   HCT 37.3 36.0 - 46.0 %   MCV 86.5 78.0 - 100.0 fL   MCH 30.9 26.0 - 34.0 pg   MCHC 35.7 30.0 - 36.0 g/dL   RDW 11.7 11.5 - 15.5 %   Platelets 159 150 - 400 K/uL  Procalcitonin     Status: None   Collection Time: 10/24/14  4:15 AM  Result Value Ref Range   Procalcitonin <0.10 ng/mL  Basic metabolic panel     Status: Abnormal   Collection Time: 10/24/14  4:15 AM  Result Value Ref Range   Sodium 139 135 - 145 mmol/L   Potassium 3.4 (L) 3.5 - 5.1 mmol/L   Chloride 105 96 - 112 mEq/L   CO2 27 19 - 32 mmol/L   Glucose, Bld 155 (H) 70 - 99 mg/dL  BUN 6 6 - 23 mg/dL   Creatinine, Ser 0.40 (L) 0.50 - 1.10 mg/dL   Calcium 8.9 8.4 - 10.5 mg/dL   GFR calc non Af Amer >90 >90 mL/min   GFR calc Af Amer >90 >90 mL/min   Anion gap 7 5 - 15  Glucose, capillary     Status: Abnormal   Collection Time: 10/24/14  6:00 AM  Result Value Ref Range   Glucose-Capillary 178 (H) 70 - 99 mg/dL   Comment 1 Documented in Chart    Comment 2 Notify RN    No intake or output data in the 24 hours ending 10/24/14 0847   ASSESSMENT AND PLAN:  ELEVATED TROPONIN:  NO coronary disease.  No further cardiac work up.    Jeneen Rinks [facility] 10/24/2014 8:47 AM

## 2014-10-24 NOTE — Progress Notes (Signed)
Inpatient Diabetes Program Recommendations  AACE/ADA: New Consensus Statement on Inpatient Glycemic Control (2013)  Target Ranges:  Prepandial:   less than 140 mg/dL      Peak postprandial:   less than 180 mg/dL (1-2 hours)      Critically ill patients:  140 - 180 mg/dL  Consult received.   Inpatient Diabetes Program Recommendations Insulin - Basal: Increase Lantus to 18 units Correction (SSI): change Novolog to moderate scale Q4 hours  Thank you  Raoul Pitch BSN, RN,CDE Inpatient Diabetes Coordinator [phone] (team pager)

## 2014-10-28 LAB — CULTURE, BLOOD (ROUTINE X 2)
CULTURE: NO GROWTH
Culture: NO GROWTH

## 2014-12-17 ENCOUNTER — Ambulatory Visit: Payer: Self-pay | Attending: Internal Medicine | Admitting: Internal Medicine

## 2014-12-17 ENCOUNTER — Encounter: Payer: Self-pay | Admitting: Internal Medicine

## 2014-12-17 VITALS — BP 140/80 | HR 82 | Temp 98.0°F | Resp 16 | Wt 133.4 lb

## 2014-12-17 DIAGNOSIS — E139 Other specified diabetes mellitus without complications: Secondary | ICD-10-CM

## 2014-12-17 DIAGNOSIS — E079 Disorder of thyroid, unspecified: Secondary | ICD-10-CM

## 2014-12-17 DIAGNOSIS — Z23 Encounter for immunization: Secondary | ICD-10-CM | POA: Insufficient documentation

## 2014-12-17 DIAGNOSIS — R0981 Nasal congestion: Secondary | ICD-10-CM | POA: Insufficient documentation

## 2014-12-17 DIAGNOSIS — Z85850 Personal history of malignant neoplasm of thyroid: Secondary | ICD-10-CM | POA: Insufficient documentation

## 2014-12-17 DIAGNOSIS — I10 Essential (primary) hypertension: Secondary | ICD-10-CM | POA: Insufficient documentation

## 2014-12-17 DIAGNOSIS — E119 Type 2 diabetes mellitus without complications: Secondary | ICD-10-CM | POA: Insufficient documentation

## 2014-12-17 DIAGNOSIS — E039 Hypothyroidism, unspecified: Secondary | ICD-10-CM | POA: Insufficient documentation

## 2014-12-17 LAB — COMPLETE METABOLIC PANEL WITH GFR
ALBUMIN: 3.8 g/dL (ref 3.5–5.2)
ALK PHOS: 173 U/L — AB (ref 39–117)
ALT: 36 U/L — AB (ref 0–35)
AST: 29 U/L (ref 0–37)
BUN: 7 mg/dL (ref 6–23)
CALCIUM: 9.3 mg/dL (ref 8.4–10.5)
CHLORIDE: 102 meq/L (ref 96–112)
CO2: 30 mEq/L (ref 19–32)
Creat: 0.43 mg/dL — ABNORMAL LOW (ref 0.50–1.10)
GFR, Est African American: >89 mL/min
GFR, Est Non African American: >89 mL/min
Glucose, Bld: 222 mg/dL — ABNORMAL HIGH (ref 70–99)
POTASSIUM: 4.3 meq/L (ref 3.5–5.3)
SODIUM: 138 meq/L (ref 135–145)
TOTAL PROTEIN: 6.8 g/dL (ref 6.0–8.3)
Total Bilirubin: 0.6 mg/dL (ref 0.2–1.2)

## 2014-12-17 LAB — TSH: TSH: 0.015 u[IU]/mL — AB (ref 0.350–4.500)

## 2014-12-17 LAB — GLUCOSE, POCT (MANUAL RESULT ENTRY): POC Glucose: 209 mg/dl — AB (ref 70–99)

## 2014-12-17 MED ORDER — GLIPIZIDE 5 MG PO TABS: 5.0000 mg | ORAL_TABLET | Freq: Two times a day (BID) | ORAL | Status: DC

## 2014-12-17 MED ORDER — LEVOTHYROXINE SODIUM 200 MCG PO TABS: 200.0000 ug | ORAL_TABLET | Freq: Every day | ORAL | Status: DC

## 2014-12-17 MED ORDER — FLUTICASONE PROPIONATE 50 MCG/ACT NA SUSP: 2.0000 | Freq: Every day | NASAL | Status: DC

## 2014-12-17 MED ORDER — OLMESARTAN MEDOXOMIL 20 MG PO TABS: 20.0000 mg | ORAL_TABLET | Freq: Every day | ORAL | Status: DC

## 2014-12-17 NOTE — Progress Notes (Signed)
Patient here for follow up on her HTN and thyroid disease Needs refills on her medications as well Requesting a prescription for her cough

## 2014-12-17 NOTE — Progress Notes (Signed)
MRN: [id number] Name: Jenny [name]  Sex: female Age: 49 y.o. DOB: 13-Aug-1966  Allergies: Review of patient's allergies indicates no known allergies.  Chief Complaint  Patient presents with  . Follow-up    HPI: Patient is 49 y.o. female who has to of diabetes hypertension history of thyroid cancer following up with the [facility], comes today for followup denies any acute symptoms denies any headache dizziness chest and shortness of breath, she also had a cardiac cath done in the month of January which was negative, apparently at that time she was started on insulin as per patient she does not take the insulin lately her fasting blood sugars more than 1 50 mg/dL only taking metformin, she is requesting information to schedule appointment with  [facility],she's complaining of some nasal congestion denies any sore throat has occasional cough denies any fever chills.patient is also requesting refill on her medications.  Past Medical History  Diagnosis Date  . Hypertension   . Thyroid cancer   . Diabetes mellitus without complication   . Metastatic disease     Past Surgical History  Procedure Laterality Date  . Thyroidectomy    . Cesarean section    . Left heart catheterization with coronary angiogram N/A 10/23/2014    Procedure: LEFT HEART CATHETERIZATION WITH CORONARY ANGIOGRAM;  Surgeon: Larey Dresser, MD;  Location: [facility] CATH LAB;  Service: Cardiovascular;  Laterality: N/A;      Medication List       This list is accurate as of: 12/17/14 11:45 AM.  Always use your most recent med list.               aspirin 81 MG chewable tablet  Chew 1 tablet (81 mg total) by mouth daily.     fluticasone 50 MCG/ACT nasal spray  Commonly known as:  FLONASE  Place 2 sprays into both nostrils daily.     glipiZIDE 5 MG tablet  Commonly known as:  GLUCOTROL  Take 1 tablet (5 mg total) by mouth 2 (two) times daily before a meal.     insulin NPH Human 100 UNIT/ML injection    Commonly known as:  HUMULIN N  Inject 0.1 mLs (10 Units total) into the skin 2 (two) times daily before a meal.     INSULIN SYRINGE .5CC/30GX1/2" 30G X 1/2" 0.5 ML Misc  Use to administer insulin as ordered     isosorbide mononitrate 30 MG 24 hr tablet  Commonly known as:  IMDUR  Take 1 tablet (30 mg total) by mouth daily.     levothyroxine 200 MCG tablet  Commonly known as:  SYNTHROID, LEVOTHROID  Take 1 tablet (200 mcg total) by mouth daily before breakfast.     metFORMIN 1000 MG tablet  Commonly known as:  GLUCOPHAGE  Take 1 tablet (1,000 mg total) by mouth 2 (two) times daily with a meal.     olmesartan 20 MG tablet  Commonly known as:  BENICAR  Take 1 tablet (20 mg total) by mouth daily.        Meds ordered this encounter  Medications  . olmesartan (BENICAR) 20 MG tablet    Sig: Take 1 tablet (20 mg total) by mouth daily.    Dispense:  30 tablet    Refill:  3  . levothyroxine (SYNTHROID, LEVOTHROID) 200 MCG tablet    Sig: Take 1 tablet (200 mcg total) by mouth daily before breakfast.    Dispense:  30 tablet    Refill:  3  . glipiZIDE (GLUCOTROL) 5 MG tablet    Sig: Take 1 tablet (5 mg total) by mouth 2 (two) times daily before a meal.    Dispense:  60 tablet    Refill:  3  . fluticasone (FLONASE) 50 MCG/ACT nasal spray    Sig: Place 2 sprays into both nostrils daily.    Dispense:  16 g    Refill:  3    There is no immunization history for the selected administration types on file for this patient.  Family History  Problem Relation Age of Onset  . Diabetes Mother   . Hypertension Mother   . Diabetes Sister   . Cancer Sister     History  Substance Use Topics  . Smoking status: Never Smoker   . Smokeless tobacco: Not on file  . Alcohol Use: No    Review of Systems   As noted in HPI  Filed Vitals:   12/17/14 1137  BP: 140/80  Pulse:   Temp:   Resp:     Physical Exam  Physical Exam  HENT:  Nasal congestion no sinus tenderness   Eyes:  EOM are normal. Pupils are equal, round, and reactive to light.  Cardiovascular: Normal rate and regular rhythm.   Pulmonary/Chest: Breath sounds normal. No respiratory distress. She has no wheezes. She has no rales.  Musculoskeletal: She exhibits no edema.    CBC    Component Value Date/Time   WBC 6.2 10/24/2014 0415   RBC 4.31 10/24/2014 0415   HGB 13.3 10/24/2014 0415   HCT 37.3 10/24/2014 0415   PLT 159 10/24/2014 0415   MCV 86.5 10/24/2014 0415    CMP     Component Value Date/Time   NA 139 10/24/2014 0415   K 3.4* 10/24/2014 0415   CL 105 10/24/2014 0415   CO2 27 10/24/2014 0415   GLUCOSE 155* 10/24/2014 0415   BUN 6 10/24/2014 0415   CREATININE 0.40* 10/24/2014 0415   CALCIUM 8.9 10/24/2014 0415   PROT 6.6 10/22/2014 0200   ALBUMIN 3.7 10/22/2014 0200   AST 23 10/22/2014 0200   ALT 24 10/22/2014 0200   ALKPHOS 133* 10/22/2014 0200   BILITOT 0.7 10/22/2014 0200   GFRNONAA >90 10/24/2014 0415   GFRAA >90 10/24/2014 0415    No results found for: CHOL  No components found for: HGA1C  Lab Results  Component Value Date/Time   AST 23 10/22/2014 02:00 AM    Assessment and Plan  Other specified diabetes mellitus without complications - Plan:  Results for orders placed or performed in visit on 12/17/14  Glucose (CBG)  Result Value Ref Range   POC Glucose 209 (A) 70 - 99 mg/dl   Diabetes uncontrolled her, last A1c was 12.9%, patient does not want to take insulin, she'll continue with metformin, I have started patient on , glipiZIDE (GLUCOTROL) 5 MG tablet, advise patient for diabetes meal planning, will repeat A1c in 3 months.  Essential hypertension, benign - Plan: blood pressure is borderline elevated, advise patient for DASH diet, continue with olmesartan (BENICAR) 20 MG tablet, COMPLETE METABOLIC PANEL WITH GFR  Thyroid disease - Plan:currently patient is on  levothyroxine (SYNTHROID, LEVOTHROID) 200 MCG tablet, will repeat TSH level  Needs flu shot Flu  shot given today. Nasal congestion - Plan: fluticasone (FLONASE) 50 MCG/ACT nasal spray  History of thyroid cancer Patient to schedule appointment with cancer Center for followup.  Health Maintenance - -Vaccinations:  Flu shot given today  Return in about  3 months (around 03/19/2015) for diabetes, hypertension, hypothyroid.   This note has been created with Surveyor, quantity. Any transcriptional errors are unintentional.    Lorayne Marek, MD

## 2014-12-19 ENCOUNTER — Telehealth: Payer: Self-pay

## 2014-12-19 DIAGNOSIS — E079 Disorder of thyroid, unspecified: Secondary | ICD-10-CM

## 2014-12-19 MED ORDER — LEVOTHYROXINE SODIUM 175 MCG PO TABS: 175.0000 ug | ORAL_TABLET | Freq: Every day | ORAL | Status: DC

## 2014-12-19 NOTE — Telephone Encounter (Signed)
-----   Message from Lorayne Marek, MD sent at 12/18/2014 10:18 AM EDT ----- Call and let the patient know that her TSH level is abnormal, currently patient is taking 200 mcg levothyroxine, advise patient to reduce to 175 mcg daily, will repeat TSH on the following visit.

## 2014-12-19 NOTE — Telephone Encounter (Signed)
belen- in house interpreter used Patient is aware of her lab results

## 2014-12-24 ENCOUNTER — Other Ambulatory Visit: Payer: Self-pay

## 2014-12-24 DIAGNOSIS — Z85850 Personal history of malignant neoplasm of thyroid: Secondary | ICD-10-CM

## 2015-01-03 ENCOUNTER — Telehealth: Payer: Self-pay | Admitting: Internal Medicine

## 2015-01-03 ENCOUNTER — Other Ambulatory Visit: Payer: Self-pay | Admitting: Internal Medicine

## 2015-01-03 ENCOUNTER — Other Ambulatory Visit: Payer: Self-pay

## 2015-01-03 MED ORDER — LOSARTAN POTASSIUM 50 MG PO TABS: 50.0000 mg | ORAL_TABLET | Freq: Every day | ORAL | Status: DC

## 2015-01-03 NOTE — Progress Notes (Unsigned)
Pharmacy is out of benicar and requesting to switch to losartan Per Dr Annitta Needs we can send new prescription to pharmacy

## 2015-01-03 NOTE — Telephone Encounter (Signed)
Pt is needing a refill for olmesartan (BENICAR) 20 MG tablet. Pharmacy does not have this medication with the [facility] Program at the moment and will take them 3 weeks to fill. So they have sent a request for the PCP to review Losartan before the pharmacy is able to fill. Please follow up with pt to let her know if and when she is able to get this filled. Pt is a Paediatric nurse.

## 2015-01-03 NOTE — Telephone Encounter (Signed)
Patient called to request a medication refill for her current blood pressure medication, patient takes Losartan but has ran out of medication. Patient was not able to get the Benicar filled due to the cost of the medication. Please f/u with patient with a [place] interpreter.

## 2015-02-04 ENCOUNTER — Telehealth: Payer: Self-pay | Admitting: Internal Medicine

## 2015-02-04 NOTE — Telephone Encounter (Signed)
disregard note

## 2015-02-26 ENCOUNTER — Telehealth: Payer: Self-pay

## 2015-02-26 ENCOUNTER — Ambulatory Visit (HOSPITAL_COMMUNITY)
Admission: RE | Admit: 2015-02-26 | Discharge: 2015-02-26 | Disposition: A | Discharge status: Home / Self Care | Payer: Medicaid Other | Source: Ambulatory Visit | Attending: Internal Medicine | Admitting: Internal Medicine

## 2015-02-26 ENCOUNTER — Ambulatory Visit: Payer: Self-pay | Attending: Internal Medicine | Admitting: Internal Medicine

## 2015-02-26 ENCOUNTER — Encounter: Payer: Self-pay | Admitting: Internal Medicine

## 2015-02-26 VITALS — BP 150/80 | HR 86 | Temp 98.0°F | Resp 16 | Wt 136.4 lb

## 2015-02-26 DIAGNOSIS — Z794 Long term (current) use of insulin: Secondary | ICD-10-CM | POA: Insufficient documentation

## 2015-02-26 DIAGNOSIS — Y9389 Activity, other specified: Secondary | ICD-10-CM | POA: Insufficient documentation

## 2015-02-26 DIAGNOSIS — W19XXXA Unspecified fall, initial encounter: Secondary | ICD-10-CM | POA: Insufficient documentation

## 2015-02-26 DIAGNOSIS — M25511 Pain in right shoulder: Secondary | ICD-10-CM | POA: Insufficient documentation

## 2015-02-26 DIAGNOSIS — Z85850 Personal history of malignant neoplasm of thyroid: Secondary | ICD-10-CM | POA: Insufficient documentation

## 2015-02-26 DIAGNOSIS — E139 Other specified diabetes mellitus without complications: Secondary | ICD-10-CM

## 2015-02-26 DIAGNOSIS — E119 Type 2 diabetes mellitus without complications: Secondary | ICD-10-CM | POA: Insufficient documentation

## 2015-02-26 DIAGNOSIS — Z7951 Long term (current) use of inhaled steroids: Secondary | ICD-10-CM | POA: Insufficient documentation

## 2015-02-26 DIAGNOSIS — E079 Disorder of thyroid, unspecified: Secondary | ICD-10-CM | POA: Insufficient documentation

## 2015-02-26 DIAGNOSIS — R918 Other nonspecific abnormal finding of lung field: Secondary | ICD-10-CM | POA: Insufficient documentation

## 2015-02-26 DIAGNOSIS — Z7982 Long term (current) use of aspirin: Secondary | ICD-10-CM | POA: Insufficient documentation

## 2015-02-26 DIAGNOSIS — I10 Essential (primary) hypertension: Secondary | ICD-10-CM | POA: Insufficient documentation

## 2015-02-26 DIAGNOSIS — S4991XA Unspecified injury of right shoulder and upper arm, initial encounter: Secondary | ICD-10-CM | POA: Insufficient documentation

## 2015-02-26 DIAGNOSIS — H538 Other visual disturbances: Secondary | ICD-10-CM | POA: Insufficient documentation

## 2015-02-26 LAB — COMPLETE METABOLIC PANEL WITH GFR
ALT: 36 U/L — ABNORMAL HIGH (ref 0–35)
AST: 34 U/L (ref 0–37)
Albumin: 3.9 g/dL (ref 3.5–5.2)
Alkaline Phosphatase: 159 U/L — ABNORMAL HIGH (ref 39–117)
BUN: 10 mg/dL (ref 6–23)
CALCIUM: 9.5 mg/dL (ref 8.4–10.5)
CO2: 29 meq/L (ref 19–32)
CREATININE: 0.45 mg/dL — AB (ref 0.50–1.10)
Chloride: 101 mEq/L (ref 96–112)
GFR, Est Non African American: >89 mL/min
Glucose, Bld: 329 mg/dL — ABNORMAL HIGH (ref 70–99)
Potassium: 4.2 mEq/L (ref 3.5–5.3)
Sodium: 139 mEq/L (ref 135–145)
TOTAL PROTEIN: 7 g/dL (ref 6.0–8.3)
Total Bilirubin: 0.6 mg/dL (ref 0.2–1.2)

## 2015-02-26 LAB — GLUCOSE, POCT (MANUAL RESULT ENTRY): POC GLUCOSE: 320 mg/dL — AB (ref 70–99)

## 2015-02-26 LAB — T4, FREE: Free T4: 2.37 ng/dL — ABNORMAL HIGH (ref 0.80–1.80)

## 2015-02-26 LAB — POCT GLYCOSYLATED HEMOGLOBIN (HGB A1C): Hemoglobin A1C: 6.3

## 2015-02-26 LAB — TSH: TSH: 0.041 u[IU]/mL — ABNORMAL LOW (ref 0.350–4.500)

## 2015-02-26 MED ORDER — IBUPROFEN 600 MG PO TABS: 600.0000 mg | ORAL_TABLET | Freq: Three times a day (TID) | ORAL | Status: DC | PRN

## 2015-02-26 NOTE — Telephone Encounter (Signed)
In house interpreter used Patient is aware of her x ray results

## 2015-02-26 NOTE — Patient Instructions (Signed)
La diabetes mellitus y los alimentos (Diabetes Mellitus and Food) Es importante que controle su nivel de azcar en la sangre (glucosa). El nivel de glucosa en sangre depende en gran medida de lo que usted come. Comer alimentos saludables en las cantidades [place] a lo largo del Training and development officer, aproximadamente a la misma hora [employer], lo ayudar a Chief Technology Officer su nivel de Multimedia programmer. Tambin puede ayudarlo a retrasar o Patent attorney de la diabetes mellitus. Comer de [employer] saludable incluso puede ayudarlo a Chartered loss adjuster de presin arterial y a Science writer o Theatre manager un peso saludable.  CMO PUEDEN AFECTARME LOS ALIMENTOS? Carbohidratos Los carbohidratos afectan el nivel de glucosa en sangre ms que cualquier otro tipo de alimento. El nutricionista lo ayudar a Teacher, adult education cuntos carbohidratos puede consumir en cada comida y ensearle a contarlos. El recuento de carbohidratos es importante para mantener la glucosa en sangre en un nivel saludable, en especial si utiliza insulina o toma determinados medicamentos para la diabetes mellitus. Alcohol El alcohol puede provocar disminuciones sbitas de la glucosa en sangre (hipoglucemia), en especial si utiliza insulina o toma determinados medicamentos para la diabetes mellitus. La hipoglucemia es una afeccin que puede poner en peligro la vida. Los sntomas de la hipoglucemia (somnolencia, mareos y Data processing manager) son similares a los sntomas de haber consumido mucho alcohol.  Si el mdico lo autoriza a beber alcohol, hgalo con moderacin y siga estas pautas:  Las mujeres no deben beber ms de un trago por da, y los hombres no deben beber ms de dos tragos por Training and development officer. Un trago es igual a:  12 onzas (355 ml) de cerveza  5 onzas de vino (150 ml) de vino  1,5onzas (35ml) de bebidas espirituosas  No beba con el estmago vaco.  Mantngase hidratado. Beba agua, gaseosas dietticas o t helado sin azcar.  Las gaseosas comunes, los jugos y  otros refrescos podran contener muchos carbohidratos y se Civil Service fast streamer. QU ALIMENTOS NO SE RECOMIENDAN? Cuando haga las elecciones de alimentos, es importante que recuerde que todos los alimentos son distintos. Algunos tienen menos nutrientes que otros por porcin, aunque podran tener la misma cantidad de caloras o carbohidratos. Es difcil darle al cuerpo lo que necesita cuando consume alimentos con menos nutrientes. Estos son algunos ejemplos de alimentos que debera evitar ya que contienen muchas caloras y carbohidratos, pero pocos nutrientes:  Physicist, medical trans (la mayora de los alimentos procesados incluyen grasas trans en la etiqueta de Informacin nutricional).  Gaseosas comunes.  Jugos.  Caramelos.  Dulces, como tortas, pasteles, rosquillas y [place].  Comidas fritas. QU ALIMENTOS PUEDO COMER? Consuma alimentos ricos en nutrientes, que nutrirn el cuerpo y lo mantendrn saludable. Los alimentos que debe comer tambin dependern de varios factores, como:  Las caloras que necesita.  Los medicamentos que toma.  Su peso.  El nivel de glucosa en [place].  El [place] de presin arterial.  El nivel de colesterol. Tambin debe consumir una variedad de [place], como:  Protenas, como carne, aves, pescado, tofu, frutos secos y semillas (las protenas de [place] magros son mejores).  Lambert Mody.  Verduras.  Productos lcteos, como [place], queso y yogur (descremados son mejores).  Panes, granos, pastas, cereales, arroz y frijoles.  Grasas, como aceite de [place], [place] sin grasas trans, aceite de canola, aguacate y [place]. TODOS LOS QUE PADECEN DIABETES MELLITUS TIENEN EL [place] PLAN DE [place]? Dado que todas las personas que padecen diabetes mellitus son distintas, no hay un solo plan de comidas que funcione para todos. Es muy  importante que se rena con un nutricionista que lo ayudar a crear un plan de comidas adecuado para usted. Document Released: 12/28/2007  Document Revised: 09/25/2013 [facility] Patient Information 2015 [place]. This information is not intended to replace advice given to you by your health care provider. Make sure you discuss any questions you have with your health care provider. Plan de alimentacin DASH (DASH Eating Plan) DASH es la sigla en ingls de "Enfoques Alimentarios para Detener la Hipertensin". El plan de alimentacin DASH ha demostrado bajar la presin arterial elevada (hipertensin). Los beneficios adicionales para la salud pueden incluir la disminucin del riesgo de diabetes mellitus tipo2, enfermedades cardacas e ictus. Este plan tambin puede ayudar a Horticulturist, commercial. QU DEBO SABER ACERCA DEL PLAN DE ALIMENTACIN DASH? Para el plan de alimentacin DASH, seguir las siguientes pautas generales:  Elija los alimentos con un valor porcentual diario de sodio de menos del 5% (segn figura en la etiqueta del alimento).  Use hierbas o aderezos sin sal, en lugar de sal de mesa o sal marina.  Consulte al mdico o farmacutico antes de usar sustitutos de la sal.  Coma productos con bajo contenido de sodio, cuya etiqueta suele decir "bajo contenido de sodio" o "sin agregado de sal".  Coma alimentos frescos.  Coma ms verduras, frutas y productos lcteos con bajo contenido de [place].  Elija los cereales integrales. Busque la palabra "integral" en Equities trader de la lista de ingredientes.  Elija el pescado y el pollo o el pavo sin piel ms a menudo que las carnes rojas. Limite el consumo de pescado, carne de ave y carne a 6onzas (170g) por Training and development officer.  Limite el consumo de dulces, postres, azcares y bebidas azucaradas.  Elija las grasas saludables para el corazn.  Limite el consumo de queso a 1onza (28g) por Training and development officer.  Consuma ms comida casera y menos de restaurante, de buf y comida rpida.  Limite el consumo de alimentos fritos.  Cocine los alimentos utilizando mtodos que no sean la fritura.  Limite las  verduras enlatadas. Si las consume, enjuguelas bien para disminuir el sodio.  Cuando coma en un restaurante, pida que preparen su comida con menos sal o, en lo posible, sin nada de sal. QU ALIMENTOS PUEDO COMER? Pida ayuda a un nutricionista para conocer las necesidades calricas individuales. Cereales Pan de salvado o integral. Arroz integral. Pastas de salvado o integrales. Quinua, trigo burgol y cereales integrales. Cereales con bajo contenido de sodio. Tortillas de harina de maz o de salvado. Pan de maz integral. Galletas saladas integrales. Galletas con bajo contenido de [place]. Vegetales Verduras frescas o congeladas (crudas, al vapor, asadas o grilladas). Jugos de tomate y verduras con contenido bajo o reducido de sodio. Pasta y salsa de tomate con contenido bajo o [place]. Verduras enlatadas con bajo contenido de sodio o reducido de sodio.  Lambert Mody Lambert Mody frescas, en conserva (en su jugo natural) o frutas congeladas. Carnes y otros productos con protenas Carne de res molida (al 85% o ms [place]), carne de res de animales alimentados con pastos o carne de res sin la grasa. Pollo o pavo sin piel. Carne de pollo o de [place]. Cerdo sin la grasa. Todos los pescados y frutos de mar. Huevos. Porotos, guisantes o lentejas secos. Frutos secos y semillas sin sal. Frijoles enlatados sin sal. Lcteos Productos lcteos con bajo contenido de grasas, como [place] o al 1%, quesos reducidos en grasas o al 2%, ricota con bajo contenido de grasas o [employer],  o yogur natural con bajo contenido de [place]. Quesos con contenido bajo o reducido de sodio. Grasas y Naval architect en barra que no contengan grasas trans. [place] y alios para ensaladas livianos o reducidos en grasas (reducidos en sodio). [place]. Aceites de crtamo, oliva o canola. [place] natural de man o almendra. Otros [place] de maz y pretzels sin sal. Los artculos mencionados arriba pueden no ser  [employer] de las bebidas o los alimentos recomendados. Comunquese con el nutricionista para conocer ms opciones. QU ALIMENTOS NO SE RECOMIENDAN? Cereales Pan blanco. Pastas blancas. Arroz blanco. Pan de maz refinado. Bagels y croissants. Galletas saladas que contengan grasas trans. Vegetales Vegetales con crema o fritos. Verduras en [place]. Verduras enlatadas comunes. Pasta y salsa de tomate en lata comunes. Jugos comunes de tomate y de verduras. Lambert Mody Frutas secas. Fruta enlatada en almbar liviano o espeso. Jugo de frutas. Carnes y otros productos con protenas [place] de carne con Lobbyist. Costillas, alas de pollo, tocineta, salchicha, mortadela, salame, chinchulines, tocino, perros calientes, salchichas alemanas y embutidos envasados. Frutos secos y semillas con sal. Frijoles con sal en lata. Lcteos Leche entera o al 2%, crema, mezcla de [place] y crema, y queso crema. Yogur entero o endulzado. Quesos o queso azul con alto contenido de Physicist, medical. [place] no lcteas y coberturas batidas. Quesos procesados, quesos para untar o cuajadas. Condimentos Sal de cebolla y ajo, sal condimentada, sal de mesa y sal marina. Salsas en lata y envasadas. Salsa [place]. Salsa trtara. Salsa barbacoa. Salsa teriyaki. Salsa de soja, incluso la que tiene contenido reducido de [place]. Salsa de carne. Salsa de pescado. Salsa de [place]. Salsa rosada. Rbano picante. Ketchup y mostaza. Saborizantes y tiernizantes para carne. Caldo en cubitos. Salsa picante. Salsa tabasco. Adobos. Aderezos para tacos. Salsas. Grasas y aceites [place], [place] en barra, [place] de [place], [place], [place] clarificada y Wendee Copp de tocino. Aceites de coco, de palmiste o de palma. Aderezos comunes para ensalada. Otros Pickles y [place]. Palomitas de maz y pretzels con sal. Los artculos mencionados arriba pueden no ser [employer] de las bebidas y los alimentos que se [name]. Comunquese con el  nutricionista para obtener ms informacin. DNDE Dolan Amen MS INFORMACIN? [place], del Pulmn y de la Sangre (National Heart, Lung, and [place]): travelstabloid.com Document Released: 09/09/2011 Document Revised: 02/04/2014 [facility] Patient Information 2015 [place], [name]. This information is not intended to replace advice given to you by your health care provider. Make sure you discuss any questions you have with your health care provider.
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
## 2015-02-26 NOTE — Telephone Encounter (Signed)
-----   Message from Lorayne Marek, MD sent at 02/26/2015  2:23 PM EDT ----- Call and let the patient know that her xray  right shoulder reported no acute bony abnormality, has prior lung nodules( history of thyroid cancer) patient to follow up with cancer center.

## 2015-02-26 NOTE — Progress Notes (Signed)
MRN: [id number] Name: Jenny [name]  Sex: female Age: 49 y.o. DOB: [birth year]  Allergies: Review of patient's allergies indicates no known allergies.  Chief Complaint  Patient presents with  . Shoulder Pain    HPI: Patient is 49 y.o. female who has history of hypertension, diabetes, thyroid cancer currently following up with the [facility] also on levothyroxine supplement, patient wanted to make an appointment at the [facility]  And needs a referral, today she's complaining of right shoulder pain for the last few days after she slipped and fell on her right shoulder the pain is 7/10, has a difficulty with full range, she has tried over-the-counter ibuprofen with some improvement but still has lot of difficulty. For diabetes she has been taking metformin denies any hypoglycemic symptoms, her hemoglobin A1c has improved. Today her blood pressure is elevated ?secondary to pain currently she is taking losartan 50 mg daily.patient also complains of blurry vision and is requesting referral to see an ophthalmologist  Past Medical History  Diagnosis Date  . Hypertension   . Thyroid cancer   . Diabetes mellitus without complication   . Metastatic disease     Past Surgical History  Procedure Laterality Date  . Thyroidectomy    . Cesarean section    . Left heart catheterization with coronary angiogram N/A 10/23/2014    Procedure: LEFT HEART CATHETERIZATION WITH CORONARY ANGIOGRAM;  Surgeon: Larey Dresser, MD;  Location: [facility] CATH LAB;  Service: Cardiovascular;  Laterality: N/A;      Medication List       This list is accurate as of: 02/26/15 12:51 PM.  Always use your most recent med list.               aspirin 81 MG chewable tablet  Chew 1 tablet (81 mg total) by mouth daily.     fluticasone 50 MCG/ACT nasal spray  Commonly known as:  FLONASE  Place 2 sprays into both nostrils daily.     ibuprofen 600 MG tablet  Commonly known as:  ADVIL,MOTRIN    Take 1 tablet (600 mg total) by mouth every 8 (eight) hours as needed.     insulin NPH Human 100 UNIT/ML injection  Commonly known as:  HUMULIN N  Inject 0.1 mLs (10 Units total) into the skin 2 (two) times daily before a meal.     isosorbide mononitrate 30 MG 24 hr tablet  Commonly known as:  IMDUR  Take 1 tablet (30 mg total) by mouth daily.     levothyroxine 175 MCG tablet  Commonly known as:  SYNTHROID, LEVOTHROID  Take 1 tablet (175 mcg total) by mouth daily before breakfast.     losartan 50 MG tablet  Commonly known as:  COZAAR  Take 1 tablet (50 mg total) by mouth daily.     metFORMIN 1000 MG tablet  Commonly known as:  GLUCOPHAGE  Take 1 tablet (1,000 mg total) by mouth 2 (two) times daily with a meal.        Meds ordered this encounter  Medications  . ibuprofen (ADVIL,MOTRIN) 600 MG tablet    Sig: Take 1 tablet (600 mg total) by mouth every 8 (eight) hours as needed.    Dispense:  30 tablet    Refill:  1    There is no immunization history for the selected administration types on file for this patient.  Family History  Problem Relation Age of Onset  . Diabetes Mother   .  Hypertension Mother   . Diabetes Sister   . Cancer Sister     History  Substance Use Topics  . Smoking status: Never Smoker   . Smokeless tobacco: Not on file  . Alcohol Use: No    Review of Systems   As noted in HPI  Filed Vitals:   02/26/15 1237  BP: 150/80  Pulse:   Temp:   Resp:     Physical Exam  Physical Exam  Constitutional: No distress.  Eyes: EOM are normal. Pupils are equal, round, and reactive to light.  Cardiovascular: Normal rate and regular rhythm.   Pulmonary/Chest: Breath sounds normal. No respiratory distress. She has no wheezes. She has no rales.  Musculoskeletal:  Right shoulder has small bruise, tenderness on the lateral aspect limited range of motion with abduction and extension    CBC    Component Value Date/Time   WBC 6.2 10/24/2014 0415    RBC 4.31 10/24/2014 0415   HGB 13.3 10/24/2014 0415   HCT 37.3 10/24/2014 0415   PLT 159 10/24/2014 0415   MCV 86.5 10/24/2014 0415    CMP     Component Value Date/Time   NA 138 12/17/2014 1146   K 4.3 12/17/2014 1146   CL 102 12/17/2014 1146   CO2 30 12/17/2014 1146   GLUCOSE 222* 12/17/2014 1146   BUN 7 12/17/2014 1146   CREATININE 0.43* 12/17/2014 1146   CREATININE 0.40* 10/24/2014 0415   CALCIUM 9.3 12/17/2014 1146   PROT 6.8 12/17/2014 1146   ALBUMIN 3.8 12/17/2014 1146   AST 29 12/17/2014 1146   ALT 36* 12/17/2014 1146   ALKPHOS 173* 12/17/2014 1146   BILITOT 0.6 12/17/2014 1146   GFRNONAA >89 12/17/2014 1146   GFRNONAA >90 10/24/2014 0415   GFRAA >89 12/17/2014 1146   GFRAA >90 10/24/2014 0415    No results found for: CHOL  Lab Results  Component Value Date/Time   HGBA1C 6.30 02/26/2015 12:14 PM   HGBA1C 12.9* 10/22/2014 12:30 AM    Lab Results  Component Value Date/Time   AST 29 12/17/2014 11:46 AM    Assessment and Plan  Other specified diabetes mellitus without complications - Plan:  Results for orders placed or performed in visit on 02/26/15  Glucose (CBG)  Result Value Ref Range   POC Glucose 320.0 (A) 70 - 99 mg/dl  HgB A1c  Result Value Ref Range   Hemoglobin A1C 6.30    Hemoglobin A1c has trended down to 6.3% from 12.9%,patient will continue with metformin. Neck is meal planning to do fingerstick log repeat A1c in 3 months.  Essential hypertension, benign - Plan: blood pressure today is  Elevated ? Secondary to pain, she is advised for DASH diet continue with losartan 50 mg daily, reevaluate on the next visit if her blood pressure will be high will increase the dose of losartan to 100 mg daily COMPLETE METABOLIC PANEL WITH GFR  Thyroid disease - Plan: TSH, T4, free  History of thyroid cancer - Plan: Ambulatory referral to Oncology  Blurry vision - Plan: Ambulatory referral to Ophthalmology  Right shoulder pain - Plan: ibuprofen  (ADVIL,MOTRIN) 600 MG tablet, DG Shoulder Right    Return in about 3 months (around 05/29/2015), or if symptoms worsen or fail to improve.   This note has been created with Surveyor, quantity. Any transcriptional errors are unintentional.    Lorayne Marek, MD

## 2015-03-04 ENCOUNTER — Telehealth: Payer: Self-pay

## 2015-03-04 DIAGNOSIS — E089 Diabetes mellitus due to underlying condition without complications: Secondary | ICD-10-CM

## 2015-03-04 MED ORDER — LEVOTHYROXINE SODIUM 150 MCG PO TABS: 150.0000 ug | ORAL_TABLET | Freq: Every day | ORAL | Status: DC

## 2015-03-04 MED ORDER — METFORMIN HCL 1000 MG PO TABS: 1000.0000 mg | ORAL_TABLET | Freq: Two times a day (BID) | ORAL | Status: DC

## 2015-03-04 NOTE — Telephone Encounter (Signed)
In house interpreter used Jenny [name] Patient is aware of her lab results Prescription  [facility]

## 2015-03-04 NOTE — Telephone Encounter (Signed)
-----   Message from Lorayne Marek, MD sent at 02/27/2015  9:40 AM EDT ----- Call and let  the patient know that her TFTs is abnormal,currently patient is on levothyroxine 175 mcg daily, advised patient to reduce the dose to 150 mcg daily will recheck TFT on the following visit.

## 2015-03-06 ENCOUNTER — Telehealth: Payer: Self-pay | Admitting: Hematology

## 2015-03-06 NOTE — Telephone Encounter (Signed)
Had interpretor place call to patient.  Scheduled appt to see Dr. Burr Medico.  Feng 03/27/15 10:30 am  Dx: History of Thyroid Cancer Referring: Dr. Annitta Needs

## 2015-03-27 ENCOUNTER — Ambulatory Visit: Payer: Medicaid Other | Admitting: Hematology

## 2015-03-27 ENCOUNTER — Ambulatory Visit: Payer: Medicaid Other

## 2015-08-11 ENCOUNTER — Telehealth: Payer: Self-pay | Admitting: Internal Medicine

## 2015-08-23 ENCOUNTER — Emergency Department (HOSPITAL_COMMUNITY)
Admission: EM | Admit: 2015-08-23 | Discharge: 2015-08-24 | Disposition: A | Discharge status: Home / Self Care | Payer: Self-pay | Attending: Emergency Medicine | Admitting: Emergency Medicine

## 2015-08-23 ENCOUNTER — Emergency Department (HOSPITAL_COMMUNITY): Payer: Self-pay

## 2015-08-23 ENCOUNTER — Encounter (HOSPITAL_COMMUNITY): Payer: Self-pay

## 2015-08-23 DIAGNOSIS — Z85850 Personal history of malignant neoplasm of thyroid: Secondary | ICD-10-CM | POA: Insufficient documentation

## 2015-08-23 DIAGNOSIS — I10 Essential (primary) hypertension: Secondary | ICD-10-CM | POA: Insufficient documentation

## 2015-08-23 DIAGNOSIS — Z9889 Other specified postprocedural states: Secondary | ICD-10-CM | POA: Insufficient documentation

## 2015-08-23 DIAGNOSIS — G4489 Other headache syndrome: Secondary | ICD-10-CM | POA: Insufficient documentation

## 2015-08-23 DIAGNOSIS — Z794 Long term (current) use of insulin: Secondary | ICD-10-CM | POA: Insufficient documentation

## 2015-08-23 DIAGNOSIS — R0602 Shortness of breath: Secondary | ICD-10-CM | POA: Insufficient documentation

## 2015-08-23 DIAGNOSIS — Z79899 Other long term (current) drug therapy: Secondary | ICD-10-CM | POA: Insufficient documentation

## 2015-08-23 DIAGNOSIS — Z7982 Long term (current) use of aspirin: Secondary | ICD-10-CM | POA: Insufficient documentation

## 2015-08-23 DIAGNOSIS — R4182 Altered mental status, unspecified: Secondary | ICD-10-CM | POA: Insufficient documentation

## 2015-08-23 DIAGNOSIS — R739 Hyperglycemia, unspecified: Secondary | ICD-10-CM

## 2015-08-23 DIAGNOSIS — Z7951 Long term (current) use of inhaled steroids: Secondary | ICD-10-CM | POA: Insufficient documentation

## 2015-08-23 DIAGNOSIS — E1165 Type 2 diabetes mellitus with hyperglycemia: Secondary | ICD-10-CM | POA: Insufficient documentation

## 2015-08-23 DIAGNOSIS — R079 Chest pain, unspecified: Secondary | ICD-10-CM | POA: Insufficient documentation

## 2015-08-23 LAB — I-STAT TROPONIN, ED: TROPONIN I, POC: 0.08 ng/mL (ref 0.00–0.08)

## 2015-08-23 LAB — CBC
HEMATOCRIT: 41.3 % (ref 36.0–46.0)
HEMOGLOBIN: 14.7 g/dL (ref 12.0–15.0)
MCH: 31.1 pg (ref 26.0–34.0)
MCHC: 35.6 g/dL (ref 30.0–36.0)
MCV: 87.3 fL (ref 78.0–100.0)
Platelets: 191 10*3/uL (ref 150–400)
RBC: 4.73 MIL/uL (ref 3.87–5.11)
RDW: 12.5 % (ref 11.5–15.5)
WBC: 9.8 10*3/uL (ref 4.0–10.5)

## 2015-08-23 LAB — BASIC METABOLIC PANEL
ANION GAP: 10 (ref 5–15)
BUN: 12 mg/dL (ref 6–20)
CALCIUM: 9.3 mg/dL (ref 8.9–10.3)
CO2: 25 mmol/L (ref 22–32)
Chloride: 93 mmol/L — ABNORMAL LOW (ref 101–111)
Creatinine, Ser: 0.67 mg/dL (ref 0.44–1.00)
Glucose, Bld: 595 mg/dL (ref 65–99)
POTASSIUM: 3.7 mmol/L (ref 3.5–5.1)
Sodium: 128 mmol/L — ABNORMAL LOW (ref 135–145)

## 2015-08-23 MED ORDER — DIPHENHYDRAMINE HCL 50 MG/ML IJ SOLN
25.0000 mg | Freq: Once | INTRAMUSCULAR | Status: AC
  Administered 2015-08-24: 25 mg via INTRAVENOUS
  Filled 2015-08-23: qty 1

## 2015-08-23 MED ORDER — SODIUM CHLORIDE 0.9 % IV SOLN
INTRAVENOUS | Status: DC
  Administered 2015-08-24: 3.9 [IU]/h via INTRAVENOUS
  Filled 2015-08-23: qty 2.5

## 2015-08-23 MED ORDER — METOCLOPRAMIDE HCL 5 MG/ML IJ SOLN
10.0000 mg | Freq: Once | INTRAMUSCULAR | Status: AC
  Administered 2015-08-24: 10 mg via INTRAVENOUS
  Filled 2015-08-23: qty 2

## 2015-08-23 NOTE — ED Provider Notes (Signed)
CSN: [id number]     Arrival date & time 08/23/15  2231 History  By signing my name below, I, Jenny [name], attest that this documentation has been prepared under the direction and in the presence of Ripley Fraise, MD. Electronically Signed: Randa [name], ED Scribe. 08/23/2015. 11:06 PM.    Chief Complaint  Patient presents with  . Chest Pain  . Headache  . Shortness of Breath  . Altered Mental Status  . Hypertension   The history is provided by the patient. A language interpreter was used [id number]).   HPI Comments: Jenny [name] is a 49 y.o. female with PMHx of HTN, DM and metastatic disease who presents to the Emergency Department complaining of CP described as a  pressure on her chest that began tonight PTA. Pt is also complaining of HA and slight SOB. Pt states that her symptoms began tonight after being really mad. Pt states that the pain is non radiating. Pt states that she feels as if her entire body is weak. Pt also states that her body and hands are numb. Pt doesn't report any medications PTA. Family member states that tonight she did seem a little more confused than normal. Pt denies abdominal pain, nausea, vomiting, LOC or visual disturbance. Pt denies Hx of MI. Denies HX of PE or DVT.    Past Medical History  Diagnosis Date  . Hypertension   . Thyroid cancer ([place])   . Diabetes mellitus without complication ([place])   . Metastatic disease [facility])    Past Surgical History  Procedure Laterality Date  . Thyroidectomy    . Cesarean section    . Left heart catheterization with coronary angiogram N/A 10/23/2014    Procedure: LEFT HEART CATHETERIZATION WITH CORONARY ANGIOGRAM;  Surgeon: Larey Dresser, MD;  Location: [facility] CATH LAB;  Service: Cardiovascular;  Laterality: N/A;   Family History  Problem Relation Age of Onset  . Diabetes Mother   . Hypertension Mother   . Diabetes Sister   . Cancer Sister    Social History  Substance Use Topics  . Smoking status: Never Smoker    . Smokeless tobacco: None  . Alcohol Use: No   OB History    No data available     Review of Systems  Eyes: Negative for visual disturbance.  Respiratory: Positive for shortness of breath.   Cardiovascular: Positive for chest pain.  Gastrointestinal: Negative for nausea, vomiting and abdominal pain.  Neurological: Positive for headaches. Negative for syncope.  All other systems reviewed and are negative.    Allergies  Review of patient's allergies indicates no known allergies.  Home Medications   Prior to Admission medications   Medication Sig Start Date End Date Taking? Authorizing Provider  aspirin 81 MG chewable tablet Chew 1 tablet (81 mg total) by mouth daily. 10/24/14   Kelvin Cellar, MD  fluticasone (FLONASE) 50 MCG/ACT nasal spray Place 2 sprays into both nostrils daily. 12/17/14   Lorayne Marek, MD  ibuprofen (ADVIL,MOTRIN) 600 MG tablet Take 1 tablet (600 mg total) by mouth every 8 (eight) hours as needed. 02/26/15   Lorayne Marek, MD  insulin NPH Human (HUMULIN N) 100 UNIT/ML injection Inject 0.1 mLs (10 Units total) into the skin 2 (two) times daily before a meal. 10/24/14   Kelvin Cellar, MD  isosorbide mononitrate (IMDUR) 30 MG 24 hr tablet Take 1 tablet (30 mg total) by mouth daily. 10/24/14   Kelvin Cellar, MD  levothyroxine (SYNTHROID, LEVOTHROID) 150 MCG tablet Take 1 tablet (150 mcg  total) by mouth daily. 03/04/15   Lorayne Marek, MD  levothyroxine (SYNTHROID, LEVOTHROID) 175 MCG tablet Take 1 tablet (175 mcg total) by mouth daily before breakfast. 12/19/14   Lorayne Marek, MD  losartan (COZAAR) 50 MG tablet Take 1 tablet (50 mg total) by mouth daily. 01/03/15   Lorayne Marek, MD  metFORMIN (GLUCOPHAGE) 1000 MG tablet Take 1 tablet (1,000 mg total) by mouth 2 (two) times daily with a meal. 03/04/15   Deepak Advani, MD   BP 174/115 mmHg  Pulse 86  Temp(Src) 98.5 F (36.9 C) (Oral)  Ht 5\' 3"  (1.6 m)  Wt 160 lb (72.576 kg)  BMI 28.35 kg/m2  SpO2 98%    Physical Exam   CONSTITUTIONAL: Well developed/well nourished HEAD: Normocephalic/atraumatic EYES: EOMI/PERRL, no nystagmus, no ptosis,  ENMT: Mucous membranes moist NECK: supple no meningeal signs, no bruits SPINE/BACK:entire spine nontender CV: S1/S2 noted, no murmurs/rubs/gallops noted, diffuse chest wall tenderness noted  LUNGS: Lungs are clear to auscultation bilaterally, no apparent distress ABDOMEN: soft, nontender, no rebound or guarding GU:no cva tenderness NEURO:Awake/alert, face symmetric, no arm or leg drift is noted Equal 5/5 strength with shoulder abduction, elbow flex/extension, wrist flex/extension in upper extremities and equal hand grips bilaterally Equal 5/5 strength with hip flexion,knee flex/extension, foot dorsi/plantar flexion Cranial nerves 3/4/5/6/04/11/09/11/12 tested and intact Sensation to light touch intact in all extremities EXTREMITIES: pulses normal, full ROM SKIN: warm, color normal PSYCH: no abnormalities of mood noted, alert and oriented to situation    ED Course  Procedures  DIAGNOSTIC STUDIES: Oxygen Saturation is 98% on RA, normal by my interpretation.    COORDINATION OF CARE: 11:26 PM-Discussed treatment plan with pt at bedside and pt agreed to plan.   11:50 PM Pt with left sided CP, occurred after argument CP is reproducible EKG abnormal but essentially unchanged Cardiac cath 01/16 completely negative I doubt ACS/PE/Dissection at this time (reports pain is pressure and reproducible in chest) She is hyperglycemic, will treat with insulin but no DKA For her HA - no neuro deficits, will treat HA and reassess.   She has h/o thyroid CA with mets that are known and managed by [facility] oncology  12:45 AM Pt feels improved No distress noted 3:24 AM Pt ambulated She feels improved She is smiling and in no distress For HA - no signs of weakness, she is ambulatory, doubt CVA/SAH or other acute neurologic process.  She has had neuroimaging  previously without brain mets For CP - this was reproducible.  She had no hypoxia on ambulation - doubt PE Her hyperglycemia improved While standing she had drop in BP, but asymptomatic IV fluids given and pt improving Stable for d/c Discussed strict return precautions with patient/family  Labs Review Labs Reviewed  BASIC METABOLIC PANEL - Abnormal; Notable for the following:    Sodium 128 (*)    Chloride 93 (*)    Glucose, Bld 595 (*)    All other components within normal limits  CBG MONITORING, ED - Abnormal; Notable for the following:    Glucose-Capillary 449 (*)    All other components within normal limits  CBG MONITORING, ED - Abnormal; Notable for the following:    Glucose-Capillary 399 (*)    All other components within normal limits  CBG MONITORING, ED - Abnormal; Notable for the following:    Glucose-Capillary 287 (*)    All other components within normal limits  CBC  I-STAT TROPOININ, ED    Imaging Review Dg Chest 2 View  08/23/2015  CLINICAL DATA:  Chest pain.  Shortness of breath.  Thyroid cancer. EXAM: CHEST  2 VIEW COMPARISON:  10/24/2014 chest radiograph. FINDINGS: Surgical clips overlie the medial right lower neck. Stable cardiomediastinal silhouette with top-normal heart size. No pneumothorax. No pleural effusion. Re- demonstrated are nodular opacities scattered throughout both lungs, most prominent in the upper right lung, not definitely changed. No pulmonary edema. No new focal lung opacity. IMPRESSION: 1. Scattered nodular opacities throughout both lungs, not appreciably changed, and in keeping with pulmonary metastases. 2. No acute cardiopulmonary disease. Electronically Signed   By: Ilona Sorrel M.D.   On: 08/23/2015 23:23   I have personally reviewed and evaluated these images and lab results as part of my medical decision-making.   EKG Interpretation   Date/Time:  Saturday August 23 2015 22:36:04 EST Ventricular Rate:  81 PR Interval:  178 QRS  Duration: 86 QT Interval:  352 QTC Calculation: 408 R Axis:   17 Text Interpretation:  Normal sinus rhythm Cannot rule out Anterior infarct  , age undetermined ST \\T \ T wave abnormality, consider inferior ischemia  Abnormal ECG No significant change since last tracing Confirmed by  Christy Gentles  MD, Derril Franek ([id number]) on 08/23/2015 11:07:30 PM      MDM   Final diagnoses:  Chest pain, unspecified chest pain type  Hyperglycemia  Other headache syndrome      Nursing notes including past medical history and social history reviewed and considered in documentation xrays/imaging reviewed by myself and considered during evaluation Labs/vital reviewed myself and considered during evaluation   I personally performed the services described in this documentation, which was scribed in my presence. The recorded information has been reviewed and is accurate.          Ripley Fraise, MD 08/24/15 [phone]

## 2015-08-23 NOTE — ED Notes (Signed)
Altered, htn, headache, cp, sob, onset 20 mins ago.

## 2015-08-24 LAB — CBG MONITORING, ED
GLUCOSE-CAPILLARY: 287 mg/dL — AB (ref 65–99)
GLUCOSE-CAPILLARY: 399 mg/dL — AB (ref 65–99)
GLUCOSE-CAPILLARY: 449 mg/dL — AB (ref 65–99)

## 2015-08-24 MED ORDER — OXYCODONE-ACETAMINOPHEN 5-325 MG PO TABS
1.0000 | ORAL_TABLET | Freq: Once | ORAL | Status: AC
  Administered 2015-08-24: 1 via ORAL
  Filled 2015-08-24: qty 1

## 2015-08-24 MED ORDER — SODIUM CHLORIDE 0.9 % IV BOLUS (SEPSIS)
1000.0000 mL | Freq: Once | INTRAVENOUS | Status: AC
  Administered 2015-08-24: 1000 mL via INTRAVENOUS

## 2015-08-24 NOTE — ED Notes (Signed)
Dr Christy Gentles stated that the pt would be discharged and to stop glucose stabilizer. Pt is stable, ambulatory and "feels much better"

## 2015-08-24 NOTE — ED Notes (Signed)
Pt verbalized understanding of d/c instructions and has no further questions. Pt stable and NAD. Pt discharged home with family driving.

## 2015-08-24 NOTE — ED Notes (Signed)
Pt CBG is 287. Nurse and MD notified.

## 2015-08-24 NOTE — ED Notes (Signed)
Pt has received 1 liter bolus and is no longer orthostatic. Dr Christy Gentles notified.

## 2015-08-24 NOTE — ED Notes (Signed)
Walked into room and pt was standing stretching her legs. BP cuff was going off and BP was 78/50, rechecked standing and 60s/40s. Dr Christy Gentles in room. Pt denies dizziness or any other sx. Pt laid back down and orthostatic vital signs checked and pt is orthostatic standing. Dr Christy Gentles stated to give the pt a liter bolus and recheck.

## 2015-08-24 NOTE — ED Notes (Signed)
Ambulated pt to the end of the hall and back. Pt's SPO2 stayed above 95% and HR steady between 82-87bpm. Pt states "I feel much better"

## 2015-09-02 ENCOUNTER — Encounter: Payer: Self-pay | Admitting: Family Medicine

## 2015-09-02 ENCOUNTER — Ambulatory Visit: Payer: Self-pay | Attending: Family Medicine | Admitting: Family Medicine

## 2015-09-02 VITALS — BP 173/101 | HR 115 | Temp 97.9°F | Resp 16 | Ht 59.0 in | Wt 142.0 lb

## 2015-09-02 DIAGNOSIS — E039 Hypothyroidism, unspecified: Secondary | ICD-10-CM

## 2015-09-02 DIAGNOSIS — C73 Malignant neoplasm of thyroid gland: Secondary | ICD-10-CM

## 2015-09-02 DIAGNOSIS — Z7984 Long term (current) use of oral hypoglycemic drugs: Secondary | ICD-10-CM | POA: Insufficient documentation

## 2015-09-02 DIAGNOSIS — N644 Mastodynia: Secondary | ICD-10-CM

## 2015-09-02 DIAGNOSIS — R918 Other nonspecific abnormal finding of lung field: Secondary | ICD-10-CM

## 2015-09-02 DIAGNOSIS — Z794 Long term (current) use of insulin: Secondary | ICD-10-CM

## 2015-09-02 DIAGNOSIS — Z0000 Encounter for general adult medical examination without abnormal findings: Secondary | ICD-10-CM

## 2015-09-02 DIAGNOSIS — E119 Type 2 diabetes mellitus without complications: Secondary | ICD-10-CM | POA: Insufficient documentation

## 2015-09-02 DIAGNOSIS — I10 Essential (primary) hypertension: Secondary | ICD-10-CM

## 2015-09-02 DIAGNOSIS — M542 Cervicalgia: Secondary | ICD-10-CM

## 2015-09-02 DIAGNOSIS — Z79899 Other long term (current) drug therapy: Secondary | ICD-10-CM | POA: Insufficient documentation

## 2015-09-02 DIAGNOSIS — Z7982 Long term (current) use of aspirin: Secondary | ICD-10-CM | POA: Insufficient documentation

## 2015-09-02 DIAGNOSIS — E1165 Type 2 diabetes mellitus with hyperglycemia: Secondary | ICD-10-CM

## 2015-09-02 DIAGNOSIS — E089 Diabetes mellitus due to underlying condition without complications: Secondary | ICD-10-CM

## 2015-09-02 LAB — POCT URINALYSIS DIPSTICK
Bilirubin, UA: NEGATIVE
Glucose, UA: 500
KETONES UA: NEGATIVE
Leukocytes, UA: NEGATIVE
Nitrite, UA: NEGATIVE
PH UA: 5.5
PROTEIN UA: 30
SPEC GRAV UA: 1.02
Urobilinogen, UA: 0.2

## 2015-09-02 LAB — GLUCOSE, POCT (MANUAL RESULT ENTRY)
POC GLUCOSE: 321 mg/dL — AB (ref 70–99)
POC Glucose: 262 mg/dl — AB (ref 70–99)

## 2015-09-02 LAB — POCT GLYCOSYLATED HEMOGLOBIN (HGB A1C): Hemoglobin A1C: 12.9

## 2015-09-02 LAB — TSH: TSH: 14.444 u[IU]/mL — AB (ref 0.350–4.500)

## 2015-09-02 MED ORDER — GABAPENTIN 100 MG PO CAPS: 100.0000 mg | ORAL_CAPSULE | Freq: Three times a day (TID) | ORAL | Status: DC

## 2015-09-02 MED ORDER — AMLODIPINE BESYLATE 5 MG PO TABS: 5.0000 mg | ORAL_TABLET | Freq: Every day | ORAL | Status: DC

## 2015-09-02 MED ORDER — INSULIN NPH ISOPHANE & REGULAR (70-30) 100 UNIT/ML ~~LOC~~ SUSP: 10.0000 [IU] | Freq: Two times a day (BID) | SUBCUTANEOUS | Status: DC

## 2015-09-02 MED ORDER — CLONIDINE HCL 0.1 MG PO TABS
0.1000 mg | ORAL_TABLET | Freq: Once | ORAL | Status: AC
  Administered 2015-09-02: 0.1 mg via ORAL

## 2015-09-02 MED ORDER — METFORMIN HCL 1000 MG PO TABS: 1000.0000 mg | ORAL_TABLET | Freq: Two times a day (BID) | ORAL | Status: DC

## 2015-09-02 MED ORDER — ACETAMINOPHEN-CODEINE #3 300-30 MG PO TABS: 1.0000 | ORAL_TABLET | Freq: Three times a day (TID) | ORAL | Status: DC | PRN

## 2015-09-02 MED ORDER — GLUCOSE BLOOD VI STRP: 1.0000 | ORAL_STRIP | Freq: Three times a day (TID) | Status: DC

## 2015-09-02 MED ORDER — TRUE METRIX METER W/DEVICE KIT: 1.0000 | PACK | Status: DC | PRN

## 2015-09-02 MED ORDER — TRUEPLUS LANCETS 28G MISC: 1.0000 | Freq: Three times a day (TID) | Status: DC

## 2015-09-02 MED ORDER — INSULIN ASPART 100 UNIT/ML ~~LOC~~ SOLN
10.0000 [IU] | Freq: Once | SUBCUTANEOUS | Status: AC
  Administered 2015-09-02: 10 [IU] via SUBCUTANEOUS

## 2015-09-02 MED ORDER — LOSARTAN POTASSIUM 100 MG PO TABS: 100.0000 mg | ORAL_TABLET | Freq: Every day | ORAL | Status: DC

## 2015-09-02 NOTE — Assessment & Plan Note (Signed)
A: recurrent papillary thyroid with metastatic disease  P: Referral to oncology

## 2015-09-02 NOTE — Assessment & Plan Note (Signed)
A: uncontrolled diabetes P: Add insulin Continue metformin Testing supplies

## 2015-09-02 NOTE — Patient Instructions (Addendum)
Jenny [name] was seen today for diabetes and hypertension.  Diagnoses and all orders for this visit:  Uncontrolled type 2 diabetes mellitus with hyperglycemia, without long-term current use of insulin (HCC) -     POCT glycosylated hemoglobin (Hb A1C) -     POCT glucose (manual entry) -     Microalbumin/Creatinine Ratio, Urine -     POCT urinalysis dipstick -     insulin NPH-regular Human (NOVOLIN 70/30) (70-30) 100 UNIT/ML injection; Inject 10 Units into the skin 2 (two) times daily with a meal. -     Blood Glucose Monitoring Suppl (TRUE METRIX METER) W/DEVICE KIT; 1 each by Does not apply route as needed. -     glucose blood (TRUE METRIX BLOOD GLUCOSE TEST) test strip; 1 each by Other route 3 (three) times daily. -     TRUEPLUS LANCETS 28G MISC; 1 each by Does not apply route 3 (three) times daily. -     gabapentin (NEURONTIN) 100 MG capsule; Take 1 capsule (100 mg total) by mouth 3 (three) times daily. -     metFORMIN (GLUCOPHAGE) 1000 MG tablet; Take 1 tablet (1,000 mg total) by mouth 2 (two) times daily with a meal.  Diabetes mellitus due to underlying condition without complication, unspecified long term insulin use status (HCC) -     insulin aspart (novoLOG) injection 10 Units; Inject 0.1 mLs (10 Units total) into the skin once. -     POCT glucose (manual entry)  Pulmonary nodules -     Ambulatory referral to Oncology  Hypothyroidism, unspecified hypothyroidism type -     TSH  Essential hypertension -     losartan (COZAAR) 100 MG tablet; Take 1 tablet (100 mg total) by mouth daily. -     cloNIDine (CATAPRES) tablet 0.1 mg; Take 1 tablet (0.1 mg total) by mouth once. -     amLODipine (NORVASC) 5 MG tablet; Take 1 tablet (5 mg total) by mouth daily.  Breast pain -     acetaminophen-codeine (TYLENOL #3) 300-30 MG tablet; Take 1 tablet by mouth every 8 (eight) hours as needed for moderate pain.  Anterior neck pain -     acetaminophen-codeine (TYLENOL #3) 300-30 MG tablet; Take 1 tablet  by mouth every 8 (eight) hours as needed for moderate pain.  Diabetes mellitus due to underlying condition without complication, with long-term current use of insulin ([place])  Healthcare maintenance -     MM DIGITAL SCREENING BILATERAL; Future  Papillary thyroid carcinoma (HCC)   F/u in 3 weeks for pharmacy CBG and BP check, 30 minute visit  F/u in 6 weeks for pap smear   Dr. Adrian Blackwater   Diabetes blood sugar goals  Fasting (in AM before breakfast, 8 hrs of no eating or drinking (except water or unsweetened coffee or tea): 90-110 2 hrs after meals: < 160,   No low sugars: nothing < 70

## 2015-09-02 NOTE — Assessment & Plan Note (Signed)
A: HTN uncontrolled P: Increase losartan to 100 mg daily Add norvasc 5 mg daily

## 2015-09-02 NOTE — Progress Notes (Signed)
Subjective:  Patient ID: Jenny [name], female    DOB: [birth year]  Age: 49 y.o. MRN: [id number] Spanish interpreter used CC: Diabetes and Hypertension   HPI Jenny [name] presents for    1. CHRONIC DIABETES  Disease Monitoring  Blood Sugar Ranges: not checking   Polyuria: no   Visual problems: yes   Medication Compliance: taking metformin only. Not taking lantus.   Medication Side Effects  Hypoglycemia: no   Preventitive Health Care  Eye Exam: due   Foot Exam: done today   Diet pattern: regular meals   Exercise: no   2. CHRONIC HYPERTENSION  Disease Monitoring  Blood pressure range: not checking   Chest pain: no   Dyspnea: no   Claudication: no   Medication compliance: yes  Medication Side Effects  Lightheadedness: no   Urinary frequency: no   Edema: no    3. Pulmonary nodules: noted on CT chest done in 10/2014. This is suspected metastatic disease from her treated thyroid cancer. She is followed by heme/once at [facility]. There are notes in care everywhere. She has trouble with transportation and misses appointments. She would like to transition [facility] to [facility]. She reports pain in anterior neck, and around her breast. No masses.   Social History  Substance Use Topics  . Smoking status: Never Smoker   . Smokeless tobacco: Not on file  . Alcohol Use: No   Outpatient Prescriptions Prior to Visit  Medication Sig Dispense Refill  . aspirin 81 MG chewable tablet Chew 1 tablet (81 mg total) by mouth daily. 30 tablet 1  . fluticasone (FLONASE) 50 MCG/ACT nasal spray Place 2 sprays into both nostrils daily. 16 g 3  . ibuprofen (ADVIL,MOTRIN) 600 MG tablet Take 1 tablet (600 mg total) by mouth every 8 (eight) hours as needed. 30 tablet 1  . isosorbide mononitrate (IMDUR) 30 MG 24 hr tablet Take 1 tablet (30 mg total) by mouth daily. 30 tablet 2  . losartan (COZAAR) 50 MG tablet Take 1 tablet (50 mg total) by mouth daily. 90 tablet 3  . metFORMIN (GLUCOPHAGE) 1000 MG tablet  Take 1 tablet (1,000 mg total) by mouth 2 (two) times daily with a meal. 60 tablet 3  . insulin NPH Human (HUMULIN N) 100 UNIT/ML injection Inject 0.1 mLs (10 Units total) into the skin 2 (two) times daily before a meal. (Patient not taking: Reported on 09/02/2015) 10 mL 11  . levothyroxine (SYNTHROID, LEVOTHROID) 150 MCG tablet Take 1 tablet (150 mcg total) by mouth daily. (Patient not taking: Reported on 09/02/2015) 90 tablet 3  . levothyroxine (SYNTHROID, LEVOTHROID) 175 MCG tablet Take 1 tablet (175 mcg total) by mouth daily before breakfast. (Patient not taking: Reported on 09/02/2015) 30 tablet 2   No facility-administered medications prior to visit.    ROS Review of Systems  Constitutional: Negative for fever and chills.  Eyes: Negative for visual disturbance.  Respiratory: Negative for shortness of breath.   Cardiovascular: Negative for chest pain.  Gastrointestinal: Negative for abdominal pain and blood in stool.  Musculoskeletal: Negative for back pain and arthralgias.  Skin: Negative for rash.  Allergic/Immunologic: Negative for immunocompromised state.  Hematological: Negative for adenopathy. Does not bruise/bleed easily.  Psychiatric/Behavioral: Negative for suicidal ideas and dysphoric mood.    Objective:  BP 171/92 mmHg  Pulse 72  Temp(Src) 97.9 F (36.6 C) (Oral)  Resp 16  Ht 4' 11" (1.499 m)  Wt 142 lb (64.411 kg)  BMI 28.67 kg/m2  SpO2 99%  BP/Weight  09/02/2015 08/24/2015 6/94/5038  Systolic BP 882 800 349  Diastolic BP 92 76 80  Wt. (Lbs) 142 142 136.4  BMI 28.67 24.36 24.94   Physical Exam  Constitutional: She is oriented to person, place, and time. She appears well-developed and well-nourished. No distress.  HENT:  Head: Normocephalic and atraumatic.  Cardiovascular: Normal rate, regular rhythm, normal heart sounds and intact distal pulses.   Pulmonary/Chest: Effort normal and breath sounds normal.  Musculoskeletal: She exhibits no edema.    Neurological: She is alert and oriented to person, place, and time.  Skin: Skin is warm and dry. No rash noted.  Psychiatric: She has a normal mood and affect.    Lab Results  Component Value Date   HGBA1C 12.90 09/02/2015   CBG  321  Treated with 10 U of novolog Treated with 0.1 mg of clonidine  Repeat CBG 262  BP: (!) 173/101 mmHg    Assessment & Plan:   Problem List Items Addressed This Visit    Diabetes mellitus due to underlying condition without complications (HCC) - Primary (Chronic)   Relevant Medications   insulin NPH-regular Human (NOVOLIN 70/30) (70-30) 100 UNIT/ML injection   Blood Glucose Monitoring Suppl (TRUE METRIX METER) W/DEVICE KIT   glucose blood (TRUE METRIX BLOOD GLUCOSE TEST) test strip   TRUEPLUS LANCETS 28G MISC   losartan (COZAAR) 100 MG tablet   Other Relevant Orders   POCT glycosylated hemoglobin (Hb A1C) (Completed)   POCT glucose (manual entry) (Completed)   Microalbumin/Creatinine Ratio, Urine   POCT urinalysis dipstick   Essential hypertension (Chronic)   Relevant Medications   losartan (COZAAR) 100 MG tablet   Hypothyroidism (Chronic)   Relevant Orders   TSH   Pulmonary nodules   Relevant Orders   Ambulatory referral to Oncology      No orders of the defined types were placed in this encounter.    Follow-up: No Follow-up on file.   Boykin Nearing MD

## 2015-09-02 NOTE — Progress Notes (Signed)
F/U DM  CC HA Pain scale #4 No tobacco user To suicide thought in the past two weeks

## 2015-09-03 ENCOUNTER — Telehealth: Payer: Self-pay | Admitting: *Deleted

## 2015-09-03 LAB — MICROALBUMIN / CREATININE URINE RATIO
Creatinine, Urine: 57 mg/dL (ref 20–320)
MICROALB UR: 14.2 mg/dL
MICROALB/CREAT RATIO: 249 ug/mg{creat} — AB (ref <30)

## 2015-09-03 MED ORDER — LEVOTHYROXINE SODIUM 300 MCG PO TABS: 300.0000 ug | ORAL_TABLET | Freq: Every day | ORAL | Status: DC

## 2015-09-03 NOTE — Telephone Encounter (Signed)
-----   Message from Boykin Nearing, MD sent at 09/03/2015  9:10 AM EST ----- TSH high, increase synthroid dose  Elevated urine microalbumin, plan to treat hyperglycemia.

## 2015-09-03 NOTE — Addendum Note (Signed)
Addended by: Boykin Nearing on: 09/03/2015 09:11 AM   Modules accepted: Orders, SmartSet

## 2015-09-04 ENCOUNTER — Telehealth: Payer: Self-pay | Admitting: Internal Medicine

## 2015-09-04 NOTE — Telephone Encounter (Signed)
[place] THE SPANISH INTERPRETER TO CALL PT IN REF TO NP APPT ON 09/16/15@1 :34 DX-PULMONARY NODULES REFERRING- DR Loma Messing

## 2015-09-09 NOTE — Telephone Encounter (Signed)
Date of birth verified by pt  Lab results given  Advised to increase synthroid  Pt verbalized understanding  Information given in Spanish

## 2015-09-16 ENCOUNTER — Encounter: Payer: Self-pay | Admitting: Internal Medicine

## 2015-09-16 ENCOUNTER — Other Ambulatory Visit: Payer: Self-pay | Admitting: Family Medicine

## 2015-09-16 ENCOUNTER — Other Ambulatory Visit (HOSPITAL_BASED_OUTPATIENT_CLINIC_OR_DEPARTMENT_OTHER): Payer: Self-pay

## 2015-09-16 ENCOUNTER — Ambulatory Visit (HOSPITAL_BASED_OUTPATIENT_CLINIC_OR_DEPARTMENT_OTHER): Payer: Self-pay | Admitting: Internal Medicine

## 2015-09-16 ENCOUNTER — Telehealth: Payer: Self-pay | Admitting: Internal Medicine

## 2015-09-16 ENCOUNTER — Ambulatory Visit: Payer: Self-pay

## 2015-09-16 ENCOUNTER — Other Ambulatory Visit: Payer: Self-pay | Admitting: Medical Oncology

## 2015-09-16 VITALS — BP 168/91 | HR 84 | Temp 98.4°F | Resp 17 | Ht 59.0 in | Wt 137.4 lb

## 2015-09-16 DIAGNOSIS — C73 Malignant neoplasm of thyroid gland: Secondary | ICD-10-CM

## 2015-09-16 DIAGNOSIS — C799 Secondary malignant neoplasm of unspecified site: Secondary | ICD-10-CM

## 2015-09-16 DIAGNOSIS — Z1231 Encounter for screening mammogram for malignant neoplasm of breast: Secondary | ICD-10-CM

## 2015-09-16 LAB — COMPREHENSIVE METABOLIC PANEL
ALT: 23 U/L (ref 0–55)
ANION GAP: 8 meq/L (ref 3–11)
AST: 19 U/L (ref 5–34)
Albumin: 4 g/dL (ref 3.5–5.0)
Alkaline Phosphatase: 156 U/L — ABNORMAL HIGH (ref 40–150)
BUN: 9.6 mg/dL (ref 7.0–26.0)
CALCIUM: 9.5 mg/dL (ref 8.4–10.4)
CHLORIDE: 103 meq/L (ref 98–109)
CO2: 28 meq/L (ref 22–29)
CREATININE: 0.8 mg/dL (ref 0.6–1.1)
EGFR: 88 mL/min/{1.73_m2} — AB (ref >90)
Glucose: 269 mg/dl — ABNORMAL HIGH (ref 70–140)
Potassium: 4 mEq/L (ref 3.5–5.1)
Sodium: 139 mEq/L (ref 136–145)
Total Bilirubin: 0.66 mg/dL (ref 0.20–1.20)
Total Protein: 7.5 g/dL (ref 6.4–8.3)

## 2015-09-16 LAB — CBC WITH DIFFERENTIAL/PLATELET
BASO%: 0.3 % (ref 0.0–2.0)
BASOS ABS: 0 10*3/uL (ref 0.0–0.1)
EOS ABS: 0.1 10*3/uL (ref 0.0–0.5)
EOS%: 0.8 % (ref 0.0–7.0)
HEMATOCRIT: 41.7 % (ref 34.8–46.6)
HGB: 14 g/dL (ref 11.6–15.9)
LYMPH#: 1.2 10*3/uL (ref 0.9–3.3)
LYMPH%: 15.8 % (ref 14.0–49.7)
MCH: 30.3 pg (ref 25.1–34.0)
MCHC: 33.6 g/dL (ref 31.5–36.0)
MCV: 90.1 fL (ref 79.5–101.0)
MONO#: 0.3 10*3/uL (ref 0.1–0.9)
MONO%: 3.7 % (ref 0.0–14.0)
NEUT#: 6.3 10*3/uL (ref 1.5–6.5)
NEUT%: 79.4 % — AB (ref 38.4–76.8)
PLATELETS: 188 10*3/uL (ref 145–400)
RBC: 4.62 10*6/uL (ref 3.70–5.45)
RDW: 13.4 % (ref 11.2–14.5)
WBC: 7.9 10*3/uL (ref 3.9–10.3)

## 2015-09-16 NOTE — Progress Notes (Signed)
[place] [phone]   [phone]  CONSULT NOTE  REFERRING PHYSICIAN: Boykin Nearing, MD  REASON FOR CONSULTATION:  49 years old Hispanic female with metastatic papillary thyroid carcinoma.  HPI Jenny [name] is a 49 y.o. female with past medical history significant for diabetes mellitus, hypertension as well as history of metastatic papillary thyroid carcinoma diagnosed initially in 2008 in [place] under the care of Dr. Heath Gold. She underwent thyroidectomy with bilateral neck dissection in June 2012. She was found at that time to have evidence for metastatic disease with bilateral pulmonary nodules. The patient was treated with radioactive iodine in March 2013. She was also found to have disease recurrence in the thyroid bed and underwent reresection with another course of radioactive iodine in January 2014. The patient most to [facility] and 2015 and has been seen by Dr. Lennart Pall at [facility]. She has been on treatment with Synthroid for TSH suppression and has been doing fine with this treatment with no evidence for disease progression. She missed 2 appointments with him secondary to distance and transportation issues. Until recently the patient was on treatment with Synthroid 225 mcg by mouth daily. She was seen recently by [facility]. Adrian [facility]. The patient mentioned that Dr. Adrian Blackwater increased her dose of Synthroid to 300 mcg by mouth daily. She also referred the patient to me today for reevaluation and to have a local medical oncologist. When seen today the patient is feeling fine with no specific complaints except for occasional headache. She also has occasional chest pain and shortness breath with exertion with mild cough. She lost around 8 pounds in the last few months. The patient also complains of some blurry vision. She is scheduled to have imaging studies at [facility] in January 2017 by Dr.  Mariel Kansky. Family history significant for mother with cancer. Sister had breast cancer. The patient is single and has 3 children. She has a daughter who is 40 and lives in [place]. She is currently on disability. She has no history of smoking, alcohol or drug abuse.  HPI  Past Medical History  Diagnosis Date  . Hypertension   . Thyroid cancer ([place])   . Diabetes mellitus without complication ([place])   . Metastatic disease [facility])     Past Surgical History  Procedure Laterality Date  . Thyroidectomy    . Cesarean section    . Left heart catheterization with coronary angiogram N/A 10/23/2014    Procedure: LEFT HEART CATHETERIZATION WITH CORONARY ANGIOGRAM;  Surgeon: Larey Dresser, MD;  Location: [facility] CATH LAB;  Service: Cardiovascular;  Laterality: N/A;    Family History  Problem Relation Age of Onset  . Diabetes Mother   . Hypertension Mother   . Diabetes Sister   . Cancer Sister     Social History Social History  Substance Use Topics  . Smoking status: Never Smoker   . Smokeless tobacco: None  . Alcohol Use: No    No Known Allergies  Current Outpatient Prescriptions  Medication Sig Dispense Refill  . acetaminophen-codeine (TYLENOL #3) 300-30 MG tablet Take 1 tablet by mouth every 8 (eight) hours as needed for moderate pain. 30 tablet 0  . amLODipine (NORVASC) 5 MG tablet Take 1 tablet (5 mg total) by mouth daily. 30 tablet 3  . aspirin 81 MG chewable tablet Chew 1 tablet (81 mg total) by mouth daily. 30 tablet 1  . fluticasone (FLONASE) 50 MCG/ACT nasal spray Place  2 sprays into both nostrils daily. 16 g 3  . glucose blood (TRUE METRIX BLOOD GLUCOSE TEST) test strip 1 each by Other route 3 (three) times daily. 100 each 11  . ibuprofen (ADVIL,MOTRIN) 600 MG tablet Take 1 tablet (600 mg total) by mouth every 8 (eight) hours as needed. 30 tablet 1  . insulin NPH-regular Human (NOVOLIN 70/30) (70-30) 100 UNIT/ML injection Inject 10 Units into the skin 2 (two) times daily with a  meal. 10 mL 11  . isosorbide mononitrate (IMDUR) 30 MG 24 hr tablet Take 1 tablet (30 mg total) by mouth daily. 30 tablet 2  . levothyroxine (SYNTHROID, LEVOTHROID) 300 MCG tablet Take 1 tablet (300 mcg total) by mouth daily before breakfast. 30 tablet 1  . losartan (COZAAR) 100 MG tablet Take 1 tablet (100 mg total) by mouth daily. 30 tablet 2  . metFORMIN (GLUCOPHAGE) 1000 MG tablet Take 1 tablet (1,000 mg total) by mouth 2 (two) times daily with a meal. 60 tablet 3  . TRUEPLUS LANCETS 28G MISC 1 each by Does not apply route 3 (three) times daily. 100 each 11  . Blood Glucose Monitoring Suppl (TRUE METRIX METER) W/DEVICE KIT 1 each by Does not apply route as needed. 1 kit 0  . gabapentin (NEURONTIN) 100 MG capsule Take 1 capsule (100 mg total) by mouth 3 (three) times daily. 90 capsule 1   No current facility-administered medications for this visit.    Review of Systems  Constitutional: positive for weight loss Eyes: negative Ears, nose, mouth, throat, and face: negative Respiratory: positive for cough, dyspnea on exertion and pleurisy/chest pain Cardiovascular: negative Gastrointestinal: negative Genitourinary:negative Integument/breast: negative Hematologic/lymphatic: negative Musculoskeletal:negative Neurological: positive for headaches Behavioral/Psych: negative Endocrine: negative Allergic/Immunologic: negative  Physical Exam  IOE:VOJJK, healthy, no distress, well nourished and well developed SKIN: skin color, texture, turgor are normal, no rashes or significant lesions HEAD: Normocephalic, No masses, lesions, tenderness or abnormalities EYES: normal, PERRLA, Conjunctiva are pink and non-injected EARS: External ears normal, Canals clear OROPHARYNX:no exudate, no erythema and lips, buccal mucosa, and tongue normal  NECK: Scar from the previous thyroid surgery. LYMPH:  no palpable lymphadenopathy, no hepatosplenomegaly BREAST:not examined LUNGS: clear to auscultation ,  and palpation HEART: regular rate & rhythm, no murmurs and no gallops ABDOMEN:abdomen soft, non-tender, normal bowel sounds and no masses or organomegaly BACK: Back symmetric, no curvature., No CVA tenderness EXTREMITIES:no joint deformities, effusion, or inflammation, no edema, no skin discoloration  NEURO: alert & oriented x 3 with fluent speech, no focal motor/sensory deficits  PERFORMANCE STATUS: ECOG 1  LABORATORY DATA: Lab Results  Component Value Date   WBC 7.9 09/16/2015   HGB 14.0 09/16/2015   HCT 41.7 09/16/2015   MCV 90.1 09/16/2015   PLT 188 09/16/2015      Chemistry      Component Value Date/Time   NA 139 09/16/2015 1348   NA 128* 08/23/2015 2250   K 4.0 09/16/2015 1348   K 3.7 08/23/2015 2250   CL 93* 08/23/2015 2250   CO2 28 09/16/2015 1348   CO2 25 08/23/2015 2250   BUN 9.6 09/16/2015 1348   BUN 12 08/23/2015 2250   CREATININE 0.8 09/16/2015 1348   CREATININE 0.67 08/23/2015 2250   CREATININE 0.45* 02/26/2015 1244      Component Value Date/Time   CALCIUM 9.5 09/16/2015 1348   CALCIUM 9.3 08/23/2015 2250   ALKPHOS 156* 09/16/2015 1348   ALKPHOS 159* 02/26/2015 1244   AST 19 09/16/2015 1348   AST  34 02/26/2015 1244   ALT 23 09/16/2015 1348   ALT 36* 02/26/2015 1244   BILITOT 0.66 09/16/2015 1348   BILITOT 0.6 02/26/2015 1244       RADIOGRAPHIC STUDIES: Dg Chest 2 View  08/23/2015  CLINICAL DATA:  Chest pain.  Shortness of breath.  Thyroid cancer. EXAM: CHEST  2 VIEW COMPARISON:  10/24/2014 chest radiograph. FINDINGS: Surgical clips overlie the medial right lower neck. Stable cardiomediastinal silhouette with top-normal heart size. No pneumothorax. No pleural effusion. Re- demonstrated are nodular opacities scattered throughout both lungs, most prominent in the upper right lung, not definitely changed. No pulmonary edema. No new focal lung opacity. IMPRESSION: 1. Scattered nodular opacities throughout both lungs, not appreciably changed, and in  keeping with pulmonary metastases. 2. No acute cardiopulmonary disease. Electronically Signed   By: Ilona Sorrel M.D.   On: 08/23/2015 23:23    ASSESSMENT: This is a very pleasant 49 years old Hispanic female with history of metastatic papillary thyroid carcinoma status post thyroidectomy with lymph node dissection as well as radioactive iodine therapy twice. The patient is currently followed by Dr. Mariel Kansky at [facility] and she is currently on treatment with Synthroid for TSH suppression.  PLAN: I had a lengthy discussion with the patient today about her current condition and treatment options. The patient is currently asymptomatic and responding well to the TSH suppression therapy.  I explained to the patient that if she has any evidence for disease progression or symptomatic disease, she may be considered for treatment with sorafenib or Lenvatinib. She is receiving good care at [facility] and I strongly recommended for the patient to continue her routine follow-up visit and management of her condition under the care of Dr. Mariel Kansky. I will order repeat TSH, thyroglobulin and thyroglobulin antibody today. She is scheduled for imaging studies at [facility] next month. I will see the patient on as-needed basis at this point. She was advised to call if she has any concerning symptoms.  The patient voices understanding of current disease status and treatment options and is in agreement with the current care plan.  All questions were answered. The patient knows to call the clinic with any problems, questions or concerns. We can certainly see the patient much sooner if necessary.  Thank you so much for allowing me to participate in the care of [facility]. I will continue to follow up the patient with you and assist in her care.  I spent 40 minutes counseling the patient face to face. The total time spent in the appointment was 60 minutes.  Disclaimer: This note was dictated with voice  recognition software. Similar sounding words can inadvertently be transcribed and may not be corrected upon review.   Carlota Philley K. September 16, 2015, 2:54 PM

## 2015-09-16 NOTE — Telephone Encounter (Signed)
sent pt back to lab

## 2015-09-17 LAB — TSH: TSH: 0.289 m(IU)/L — ABNORMAL LOW (ref 0.308–3.960)

## 2015-09-17 LAB — THYROGLOBULIN LEVEL: THYROGLOBULIN: 45.8 ng/mL — AB (ref 2.8–40.9)

## 2015-09-18 LAB — THYROGLOBULIN ANTIBODY: Thyroglobulin Ab: <1 [IU]/mL (ref <2)

## 2015-10-08 ENCOUNTER — Ambulatory Visit
Admission: RE | Admit: 2015-10-08 | Discharge: 2015-10-08 | Disposition: A | Discharge status: Home / Self Care | Payer: Medicaid Other | Source: Ambulatory Visit | Attending: Family Medicine | Admitting: Family Medicine

## 2015-10-08 DIAGNOSIS — Z1231 Encounter for screening mammogram for malignant neoplasm of breast: Secondary | ICD-10-CM

## 2015-10-08 MED FILL — LEVOTHYROXINE 300 MCG TAB: 300 | 30 days supply | Qty: 30 | Fill #0

## 2015-10-08 MED FILL — metFORMIN HCL 1000 MG TABS: 1000 | 30 days supply | Qty: 60 | Fill #0

## 2015-10-29 MED FILL — LEVOTHYROXINE 300 MCG TAB: 300 | 30 days supply | Qty: 30 | Fill #1

## 2015-10-29 MED FILL — LOSARTAN POTASSIUM 100 MG T: 100 | 30 days supply | Qty: 30 | Fill #1

## 2015-12-01 MED FILL — ?METFORMIN HCL 1,000 MG TAB: 1000 | 30 days supply | Qty: 60 | Fill #1

## 2015-12-09 ENCOUNTER — Emergency Department (HOSPITAL_COMMUNITY)
Admission: EM | Admit: 2015-12-09 | Discharge: 2015-12-09 | Disposition: A | Discharge status: Home / Self Care | Payer: Medicaid Other | Attending: Emergency Medicine | Admitting: Emergency Medicine

## 2015-12-09 ENCOUNTER — Emergency Department (HOSPITAL_COMMUNITY): Payer: Medicaid Other

## 2015-12-09 ENCOUNTER — Encounter (HOSPITAL_COMMUNITY): Payer: Self-pay | Admitting: *Deleted

## 2015-12-09 DIAGNOSIS — E119 Type 2 diabetes mellitus without complications: Secondary | ICD-10-CM | POA: Insufficient documentation

## 2015-12-09 DIAGNOSIS — Y9289 Other specified places as the place of occurrence of the external cause: Secondary | ICD-10-CM | POA: Insufficient documentation

## 2015-12-09 DIAGNOSIS — I10 Essential (primary) hypertension: Secondary | ICD-10-CM | POA: Insufficient documentation

## 2015-12-09 DIAGNOSIS — Z85850 Personal history of malignant neoplasm of thyroid: Secondary | ICD-10-CM | POA: Insufficient documentation

## 2015-12-09 DIAGNOSIS — W01198A Fall on same level from slipping, tripping and stumbling with subsequent striking against other object, initial encounter: Secondary | ICD-10-CM | POA: Insufficient documentation

## 2015-12-09 DIAGNOSIS — Z79899 Other long term (current) drug therapy: Secondary | ICD-10-CM | POA: Insufficient documentation

## 2015-12-09 DIAGNOSIS — Y9389 Activity, other specified: Secondary | ICD-10-CM | POA: Insufficient documentation

## 2015-12-09 DIAGNOSIS — S2232XA Fracture of one rib, left side, initial encounter for closed fracture: Secondary | ICD-10-CM

## 2015-12-09 DIAGNOSIS — Y998 Other external cause status: Secondary | ICD-10-CM | POA: Insufficient documentation

## 2015-12-09 DIAGNOSIS — Z7951 Long term (current) use of inhaled steroids: Secondary | ICD-10-CM | POA: Insufficient documentation

## 2015-12-09 DIAGNOSIS — Z794 Long term (current) use of insulin: Secondary | ICD-10-CM | POA: Insufficient documentation

## 2015-12-09 DIAGNOSIS — Z9889 Other specified postprocedural states: Secondary | ICD-10-CM | POA: Insufficient documentation

## 2015-12-09 DIAGNOSIS — Z7982 Long term (current) use of aspirin: Secondary | ICD-10-CM | POA: Insufficient documentation

## 2015-12-09 DIAGNOSIS — Z7984 Long term (current) use of oral hypoglycemic drugs: Secondary | ICD-10-CM | POA: Insufficient documentation

## 2015-12-09 MED ORDER — OXYCODONE-ACETAMINOPHEN 5-325 MG PO TABS: 2.0000 | ORAL_TABLET | ORAL | Status: DC | PRN

## 2015-12-09 MED ORDER — OXYCODONE-ACETAMINOPHEN 5-325 MG PO TABS
1.0000 | ORAL_TABLET | Freq: Once | ORAL | Status: AC
  Administered 2015-12-09: 1 via ORAL
  Filled 2015-12-09: qty 1

## 2015-12-09 NOTE — ED Notes (Signed)
Pt instructed how to use Incentive spirometer

## 2015-12-09 NOTE — ED Notes (Signed)
See PA note for secondary assessment.   

## 2015-12-09 NOTE — ED Provider Notes (Signed)
CSN: [id number]     Arrival date & time 12/09/15  2008 History  By signing my name below, I, Altamease Oiler, attest that this documentation has been prepared under the direction and in the presence of Samantha Dowless. Electronically Signed: Altamease Oiler, ED Scribe. 12/09/2015 8:48 PM   Chief Complaint  Patient presents with  . Fall   The history is provided by the patient. A language interpreter was used [place]).   Jenny [name] is a 50 y.o. female with history of metastatic thyroid cancer who presents to the Emergency Department complaining of ongoing 9/10 in severity, left rib pain with onset 1 week ago after a fall. Pt states that she tripped and fell to the left side. When she fell she struck her ribs with her left elbow. Deep breathing and coughing exacerbate the rib pain. Ibuprofen provided insufficient relief in pain at home. No head injury or LOC.  Pt denies elbow pain.   Past Medical History  Diagnosis Date  . Hypertension   . Thyroid cancer ([place])   . Diabetes mellitus without complication ([place])   . Metastatic disease [facility])    Past Surgical History  Procedure Laterality Date  . Thyroidectomy    . Cesarean section    . Left heart catheterization with coronary angiogram N/A 10/23/2014    Procedure: LEFT HEART CATHETERIZATION WITH CORONARY ANGIOGRAM;  Surgeon: Larey Dresser, MD;  Location: [facility] CATH LAB;  Service: Cardiovascular;  Laterality: N/A;   Family History  Problem Relation Age of Onset  . Diabetes Mother   . Hypertension Mother   . Diabetes Sister   . Cancer Sister    Social History  Substance Use Topics  . Smoking status: Never Smoker   . Smokeless tobacco: None  . Alcohol Use: No   OB History    No data available     Review of Systems  Musculoskeletal:       Left rib pain  Neurological: Negative for syncope.  All other systems reviewed and are negative.   Allergies  Review of patient's allergies indicates no known allergies.  Home  Medications   Prior to Admission medications   Medication Sig Start Date End Date Taking? Authorizing Provider  acetaminophen-codeine (TYLENOL #3) 300-30 MG tablet Take 1 tablet by mouth every 8 (eight) hours as needed for moderate pain. 09/02/15   Josalyn Funches, MD  amLODipine (NORVASC) 5 MG tablet Take 1 tablet (5 mg total) by mouth daily. 09/02/15   Boykin Nearing, MD  aspirin 81 MG chewable tablet Chew 1 tablet (81 mg total) by mouth daily. 10/24/14   Kelvin Cellar, MD  Blood Glucose Monitoring Suppl (TRUE METRIX METER) W/DEVICE KIT 1 each by Does not apply route as needed. 09/02/15   Josalyn Funches, MD  fluticasone (FLONASE) 50 MCG/ACT nasal spray Place 2 sprays into both nostrils daily. 12/17/14   Lorayne Marek, MD  gabapentin (NEURONTIN) 100 MG capsule Take 1 capsule (100 mg total) by mouth 3 (three) times daily. 09/02/15   Josalyn Funches, MD  glucose blood (TRUE METRIX BLOOD GLUCOSE TEST) test strip 1 each by Other route 3 (three) times daily. 09/02/15   Josalyn Funches, MD  ibuprofen (ADVIL,MOTRIN) 600 MG tablet Take 1 tablet (600 mg total) by mouth every 8 (eight) hours as needed. 02/26/15   Lorayne Marek, MD  insulin NPH-regular Human (NOVOLIN 70/30) (70-30) 100 UNIT/ML injection Inject 10 Units into the skin 2 (two) times daily with a meal. 09/02/15   Boykin Nearing, MD  isosorbide  mononitrate (IMDUR) 30 MG 24 hr tablet Take 1 tablet (30 mg total) by mouth daily. 10/24/14   Kelvin Cellar, MD  levothyroxine (SYNTHROID, LEVOTHROID) 300 MCG tablet Take 1 tablet (300 mcg total) by mouth daily before breakfast. 09/03/15   Boykin Nearing, MD  losartan (COZAAR) 100 MG tablet Take 1 tablet (100 mg total) by mouth daily. 09/02/15   Boykin Nearing, MD  metFORMIN (GLUCOPHAGE) 1000 MG tablet Take 1 tablet (1,000 mg total) by mouth 2 (two) times daily with a meal. 09/02/15   Josalyn Funches, MD  TRUEPLUS LANCETS 28G MISC 1 each by Does not apply route 3 (three) times daily. 09/02/15   Josalyn  Funches, MD   BP 180/109 mmHg  Pulse 88  Temp(Src) 98.1 F (36.7 C) (Oral)  Resp 18  SpO2 96% Physical Exam  Constitutional: She is oriented to person, place, and time. She appears well-developed and well-nourished. No distress.  HENT:  Head: Normocephalic and atraumatic.  Eyes: Conjunctivae are normal. Right eye exhibits no discharge. Left eye exhibits no discharge. No scleral icterus.  Cardiovascular: Normal rate.   Pulmonary/Chest: Effort normal and breath sounds normal.  Chest expansion equal bilaterally.  Musculoskeletal:  TTP over 8, 9, 10 ribs in mid axillary line. NO obvious bony deformity.  Neurological: She is alert and oriented to person, place, and time. Coordination normal.  Skin: Skin is warm and dry. No rash noted. She is not diaphoretic. No erythema. No pallor.  Psychiatric: She has a normal mood and affect. Her behavior is normal.  Nursing note and vitals reviewed.   ED Course  Procedures (including critical care time) DIAGNOSTIC STUDIES: Oxygen Saturation is 96% on RA,  normal by my interpretation.    COORDINATION OF CARE: 8:46 PM Discussed treatment plan which includes XR of the left ribs with pt at bedside and pt agreed to plan.  Labs Review Labs Reviewed - No data to display  Imaging Review Dg Ribs Unilateral W/chest Left  12/09/2015  CLINICAL DATA:  Patient fell 1 week ago. Lower left rib pain. History of metastatic disease, presumably due to thyroid cancer. EXAM: LEFT RIBS AND CHEST - 3+ VIEW COMPARISON:  08/23/2015 FINDINGS: Diffuse bilateral pulmonary nodules demonstrated throughout the lungs consistent with history of metastatic thyroid cancer. No definite progression since previous study. Surgical clips in the right apex. No focal consolidation in the lungs. No blunting of costophrenic angles. No pneumothorax. Normal heart size and pulmonary vascularity. Focal cortical step-off at the distal aspect of the anterior left ninth rib suggesting nondisplaced  fracture. No other fractures identified in the left ribs. No focal bone lesion or bone destruction. IMPRESSION: Diffuse bilateral pulmonary nodules consistent with known metastatic disease. Nondisplaced fracture of the anterior left ninth rib. Electronically Signed   By: Lucienne Capers M.D.   On: 12/09/2015 21:13   I have personally reviewed and evaluated these images as part of my medical decision-making.   EKG Interpretation None      MDM   Final diagnoses:  Rib fracture, left, closed, initial encounter   Xray reveals nondiplaced 9th rib fracture. Trauma occurred 1 week ago. No sign of PNA on xray. Evidence of several pulmonary nodule c/w known metastatic disease. No bony destruction noted. Pt given incentive spirometry and pain medication. Follow up with PCP indicated. Return precautions outlined in patient discharge instructions.   Patient was discussed with and seen by Dr. Winfred Leeds who agrees with the treatment plan.     I personally performed the services described in this  documentation, which was scribed in my presence. The recorded information has been reviewed and is accurate.     Dondra Spry [place], PA-C 12/10/15 [place], MD 12/10/15 [id number]

## 2015-12-09 NOTE — ED Notes (Signed)
Pt states that she fell last Tuesday and her left rib cage and back have been hurting since. States she has been taking ibuprofen for the pain with little relief.

## 2015-12-09 NOTE — Discharge Instructions (Signed)
Jenny Jenny [name]  (Rib Fracture)  Una fractura de Jenny [name] es la ruptura de uno de los huesos que la forman. Las costillas son un grupo de huesos largos y curvos que rodean el pecho y estn adheridos a la columna vertebral. Protegen a los pulmones y a otros rganos que se encuentran en la cavidad torcica. La fractura o fisura de una Jenny [name] puede ser dolorosa pero no causa otros problemas. La mayora de las fracturas de costillas se curan por s mismas luego de un tiempo. Sin embargo, [name] broker a ser ms grave si se rompen varias costillas o si se desplazan y Therapist, art. [name]   Un golpe directo en el pecho. Por ejemplo, esto puede ocurrir [name] prctica de deportes de [name], un accidente automovilstico o una cada sobre un [place] duro.  Los movimientos repetitivos con una gran fuerza, como al lanzar una pelota en el bisbol, o tener episodios de tos intensos. SNTOMAS   Dolor al respirar o al toser.  Dolor cuando alguien presiona la zona lesionada. DIAGNSTICO  El [employer] har un examen fsico. Le indicar diferentes estudios por imgenes para confirmar el diagnstico y buscar lesiones relacionadas. Estos estudios incluyen radiografas de trax, tomografa computada (TC), resonancia magntica (MRI) o gammagrafa sea.  [name] fracturas de costillas se curan por s mismas en 1 a 3 meses. Los perodos de curacin ms prolongados generalmente se asocian a tos continua o a otras actividades que agravan el problema. Durante el perodo de curacin es muy importante el control del dolor. Generalmente se recetan medicamentos para Financial controller. Ser necesaria la hospitalizacin o una ciruga si hay lesiones ms graves, como las fracturas de mltiples costillas o aquellas en las que las costillas se desplazan.  INSTRUCCIONES PARA EL CUIDADO EN EL HOGAR   Evite las actividades extenuantes y toda [place] o movimiento que le cause dolor.  Realice las actividades con cuidado y evite golpearse las costillas lesionadas.  Reanude la actividad sexual cuando le indique su mdico.  Tome slo medicamentos de venta libre o recetados, segn las indicaciones del mdico. No tome otros medicamentos sin Teacher, adult education a su mdico.  Aplique hielo en la zona [name] primeros 1 a 2 das despus de haber recibido tratamiento o segn las indicaciones de su mdico. La aplicacin del hielo ayuda a reducir la inflamacin y Conservation officer, historic buildings.  Ponga el hielo en una bolsa plstica.  Colquese una toalla entre la piel y la bolsa de hielo.   Deje el hielo durante 15 a 20 minutos, cada 2 horas mientras se encuentre despierto.  Haga ejercicios de [name] indique su mdico. Esto ayudar a evitar la neumona, que es una complicacin frecuente en las fracturas de [name]. El mdico le indicar que:  Haga respiraciones profundas varias veces al da.  Trate de toser [employer] al da, sosteniendo el rea lesionada con [name].  Use un dispositivo llamado espirmetro incentivo para realizar respiraciones profundas varias veces por da.  Beba suficiente lquido para Consulting civil engineer orina clara o de color amarillo plido. Esto le ayudar a [name].   No use cinturones ni sujetadores. Esta limitacin de la respiracin puede causar neumona.  SOLICITE ATENCIN MDICA DE INMEDIATO SI:   Tiene fiebre.  Tiene dificultad para respirar o Risk manager.   Tiene tos continua o elimina moco espeso o sanguinolento.  Tiene [name] (nuseas), devuelve (vomita), o tiene dolor abdominal.   El dolor  aumenta y no se alivia con los [name].  ASEGRESE DE QUE:   Comprende estas instrucciones.  Controlar su enfermedad.  Recibir ayuda de inmediato si no mejora o si empeora.   Esta informacin no tiene [name] el consejo del mdico. Asegrese de hacerle al mdico cualquier pregunta que tenga.    Follow-up with her primary care provider if your symptoms not improved in one week. Use incentive spirometer daily to improve lung function while you're refracture heals. Take Percocet as needed for pain. Return to the emergency department if you experience severe worsening of your symptoms, fever, difficulty breathing.

## 2015-12-09 NOTE — ED Provider Notes (Signed)
History is obtained from medical interpreter patient speaks willing to speak. Patient fell one week ago striking her left ribs. Pain worse with deep inspirations or with changing position. She complains of pain coming approximate 3 cm left lateral rib cage. She is tender over left rib cage covering of possibly 3 cm area. No ecchymosis or flail. Lungs clear to auscultation. X-ray viewed by me  Orlie Dakin, MD 12/09/15 2157

## 2016-03-17 ENCOUNTER — Other Ambulatory Visit: Payer: Self-pay | Admitting: Family Medicine

## 2016-03-17 MED FILL — ?LOSARTAN POTASSIUM 100 MG: 100 | 30 days supply | Qty: 30 | Fill #2

## 2016-03-17 MED FILL — LEVOTHYROXINE 300 MCG TAB: 300 | 30 days supply | Qty: 30 | Fill #0

## 2016-05-29 ENCOUNTER — Emergency Department (HOSPITAL_COMMUNITY): Payer: Self-pay

## 2016-05-29 ENCOUNTER — Encounter (HOSPITAL_COMMUNITY): Payer: Self-pay | Admitting: Emergency Medicine

## 2016-05-29 ENCOUNTER — Emergency Department (HOSPITAL_COMMUNITY)
Admission: EM | Admit: 2016-05-29 | Discharge: 2016-05-29 | Disposition: A | Discharge status: Home / Self Care | Payer: Self-pay | Attending: Emergency Medicine | Admitting: Emergency Medicine

## 2016-05-29 DIAGNOSIS — N823 Fistula of vagina to large intestine: Secondary | ICD-10-CM | POA: Insufficient documentation

## 2016-05-29 DIAGNOSIS — E039 Hypothyroidism, unspecified: Secondary | ICD-10-CM | POA: Insufficient documentation

## 2016-05-29 DIAGNOSIS — J189 Pneumonia, unspecified organism: Secondary | ICD-10-CM | POA: Insufficient documentation

## 2016-05-29 DIAGNOSIS — Z7984 Long term (current) use of oral hypoglycemic drugs: Secondary | ICD-10-CM | POA: Insufficient documentation

## 2016-05-29 DIAGNOSIS — R739 Hyperglycemia, unspecified: Secondary | ICD-10-CM

## 2016-05-29 DIAGNOSIS — Z7982 Long term (current) use of aspirin: Secondary | ICD-10-CM | POA: Insufficient documentation

## 2016-05-29 DIAGNOSIS — I10 Essential (primary) hypertension: Secondary | ICD-10-CM | POA: Insufficient documentation

## 2016-05-29 DIAGNOSIS — Z79899 Other long term (current) drug therapy: Secondary | ICD-10-CM | POA: Insufficient documentation

## 2016-05-29 DIAGNOSIS — Z794 Long term (current) use of insulin: Secondary | ICD-10-CM | POA: Insufficient documentation

## 2016-05-29 DIAGNOSIS — C801 Malignant (primary) neoplasm, unspecified: Secondary | ICD-10-CM | POA: Insufficient documentation

## 2016-05-29 DIAGNOSIS — E1165 Type 2 diabetes mellitus with hyperglycemia: Secondary | ICD-10-CM | POA: Insufficient documentation

## 2016-05-29 DIAGNOSIS — C7800 Secondary malignant neoplasm of unspecified lung: Secondary | ICD-10-CM | POA: Insufficient documentation

## 2016-05-29 LAB — I-STAT VENOUS BLOOD GAS, ED
Acid-Base Excess: 5 mmol/L — ABNORMAL HIGH (ref 0.0–2.0)
Bicarbonate: 29.8 mEq/L — ABNORMAL HIGH (ref 20.0–24.0)
O2 Saturation: 95 %
PCO2 VEN: 44.5 mmHg — AB (ref 45.0–50.0)
PH VEN: 7.434 — AB (ref 7.250–7.300)
TCO2: 31 mmol/L (ref 0–100)
pO2, Ven: 76 mmHg — ABNORMAL HIGH (ref 31.0–45.0)

## 2016-05-29 LAB — CBC WITH DIFFERENTIAL/PLATELET
BASOS ABS: 0 10*3/uL (ref 0.0–0.1)
Basophils Relative: 0 %
EOS PCT: 0 %
Eosinophils Absolute: 0 10*3/uL (ref 0.0–0.7)
HEMATOCRIT: 33.3 % — AB (ref 36.0–46.0)
HEMOGLOBIN: 11.2 g/dL — AB (ref 12.0–15.0)
LYMPHS ABS: 0.6 10*3/uL — AB (ref 0.7–4.0)
LYMPHS PCT: 8 %
MCH: 29.8 pg (ref 26.0–34.0)
MCHC: 33.6 g/dL (ref 30.0–36.0)
MCV: 88.6 fL (ref 78.0–100.0)
Monocytes Absolute: 0.5 10*3/uL (ref 0.1–1.0)
Monocytes Relative: 6 %
NEUTROS ABS: 6.3 10*3/uL (ref 1.7–7.7)
Neutrophils Relative %: 86 %
PLATELETS: 214 10*3/uL (ref 150–400)
RBC: 3.76 MIL/uL — AB (ref 3.87–5.11)
RDW: 12.2 % (ref 11.5–15.5)
WBC: 7.3 10*3/uL (ref 4.0–10.5)

## 2016-05-29 LAB — URINALYSIS, ROUTINE W REFLEX MICROSCOPIC
BILIRUBIN URINE: NEGATIVE
Glucose, UA: >1000 mg/dL — AB
Ketones, ur: NEGATIVE mg/dL
Leukocytes, UA: NEGATIVE
NITRITE: NEGATIVE
PH: 6 (ref 5.0–8.0)
Protein, ur: 30 mg/dL — AB
SPECIFIC GRAVITY, URINE: 1.024 (ref 1.005–1.030)

## 2016-05-29 LAB — URINE MICROSCOPIC-ADD ON: BACTERIA UA: NONE SEEN

## 2016-05-29 LAB — LIPASE, BLOOD: Lipase: 37 U/L (ref 11–51)

## 2016-05-29 LAB — COMPREHENSIVE METABOLIC PANEL
ALT: 17 U/L (ref 14–54)
AST: 27 U/L (ref 15–41)
Albumin: 2.9 g/dL — ABNORMAL LOW (ref 3.5–5.0)
Alkaline Phosphatase: 123 U/L (ref 38–126)
Anion gap: 7 (ref 5–15)
BUN: 6 mg/dL (ref 6–20)
CHLORIDE: 95 mmol/L — AB (ref 101–111)
CO2: 27 mmol/L (ref 22–32)
CREATININE: 0.55 mg/dL (ref 0.44–1.00)
Calcium: 8.6 mg/dL — ABNORMAL LOW (ref 8.9–10.3)
GFR calc non Af Amer: >60 mL/min (ref >60)
Glucose, Bld: 389 mg/dL — ABNORMAL HIGH (ref 65–99)
POTASSIUM: 3 mmol/L — AB (ref 3.5–5.1)
Sodium: 129 mmol/L — ABNORMAL LOW (ref 135–145)
Total Bilirubin: 0.8 mg/dL (ref 0.3–1.2)
Total Protein: 6.7 g/dL (ref 6.5–8.1)

## 2016-05-29 LAB — I-STAT CG4 LACTIC ACID, ED
Lactic Acid, Venous: 1.34 mmol/L (ref 0.5–1.9)
Lactic Acid, Venous: 1.04 mmol/L (ref 0.5–1.9)

## 2016-05-29 MED ORDER — ACETAMINOPHEN 325 MG PO TABS
650.0000 mg | ORAL_TABLET | Freq: Once | ORAL | Status: AC
  Administered 2016-05-29: 650 mg via ORAL
  Filled 2016-05-29: qty 2

## 2016-05-29 MED ORDER — ACETAMINOPHEN 325 MG PO TABS
650.0000 mg | ORAL_TABLET | Freq: Once | ORAL | Status: AC | PRN
  Administered 2016-05-29: 650 mg via ORAL

## 2016-05-29 MED ORDER — DIATRIZOATE MEGLUMINE & SODIUM 66-10 % PO SOLN
ORAL | Status: AC
  Administered 2016-05-29: 17:00:00
  Filled 2016-05-29: qty 30

## 2016-05-29 MED ORDER — IOPAMIDOL (ISOVUE-300) INJECTION 61%
INTRAVENOUS | Status: AC
  Administered 2016-05-29: 100 mL
  Filled 2016-05-29: qty 100

## 2016-05-29 MED ORDER — ACETAMINOPHEN 325 MG PO TABS: 325.0000 mg | ORAL_TABLET | Freq: Once | ORAL | Status: DC

## 2016-05-29 MED ORDER — ACETAMINOPHEN 325 MG PO TABS
ORAL_TABLET | ORAL | Status: AC
  Filled 2016-05-29: qty 2

## 2016-05-29 MED ORDER — POTASSIUM CHLORIDE CRYS ER 20 MEQ PO TBCR
40.0000 meq | EXTENDED_RELEASE_TABLET | Freq: Once | ORAL | Status: AC
  Administered 2016-05-29: 40 meq via ORAL
  Filled 2016-05-29: qty 2

## 2016-05-29 MED ORDER — ONDANSETRON HCL 4 MG PO TABS: 4.0000 mg | ORAL_TABLET | Freq: Four times a day (QID) | ORAL | 0 refills | Status: DC | PRN

## 2016-05-29 MED ORDER — ONDANSETRON 4 MG PO TBDP
ORAL_TABLET | ORAL | Status: AC
Start: 2016-05-29 — End: 2016-05-29
  Administered 2016-05-29: 4 mg
  Filled 2016-05-29: qty 1

## 2016-05-29 MED ORDER — ONDANSETRON 4 MG PO TBDP: 4.0000 mg | ORAL_TABLET | Freq: Once | ORAL | Status: DC | PRN

## 2016-05-29 MED ORDER — MOXIFLOXACIN HCL 400 MG PO TABS: 400.0000 mg | ORAL_TABLET | Freq: Every day | ORAL | 0 refills | Status: AC

## 2016-05-29 MED ORDER — SODIUM CHLORIDE 0.9 % IV BOLUS (SEPSIS)
1000.0000 mL | Freq: Once | INTRAVENOUS | Status: AC
  Administered 2016-05-29: 1000 mL via INTRAVENOUS

## 2016-05-29 MED ORDER — DIATRIZOATE MEGLUMINE & SODIUM 66-10 % PO SOLN
ORAL | Status: AC
  Administered 2016-05-29: 19:00:00
  Filled 2016-05-29: qty 30

## 2016-05-29 NOTE — ED Triage Notes (Signed)
Pt here with n/v/d/ ha/ fever, poor oral intake x 2 weeks. Pt also sts abdominal pain.

## 2016-05-29 NOTE — ED Notes (Signed)
Pt c/o headache, requesting tylenol. MD made aware

## 2016-05-29 NOTE — ED Provider Notes (Signed)
[facility] DEPT Provider Note   CSN: [id number] Arrival date & time: 05/29/16  1352   History   Chief Complaint Chief Complaint  Patient presents with  . Abdominal Pain  . Fever  . Emesis    HPI Jenny [name] is a 50 y.o. female.  50 year old female with history of metastatic papillary thyroid carcinoma, not on chemotherapy or radiation, hypertension, diabetes presenting with fever and illness for 2 weeks. Onset 2 weeks ago. Worsening since then. Moderate to severe. Not alleviated by NSAIDs or a course of antibiotics given by PCP. Associated with upper abdominal pain bilaterally, cough and shortness of breath, nausea, nonbloody nonbilious emesis, and loose stool. Pain described as soreness and is nonradiating, diffuse throughout entire upper half abdomen. No urinary symptoms. No chest pain. No rashes, rhinorrhea, ear pain, congestion. No vaginal discharge or bleeding.   The history is provided by the patient, a relative and medical records. A language interpreter was used.    Past Medical History:  Diagnosis Date  . Diabetes mellitus without complication ([place])   . Hypertension   . Metastatic disease ([place])   . Thyroid cancer [facility])     Patient Active Problem List   Diagnosis Date Noted  . Pulmonary nodules 09/02/2015  . Breast pain 09/02/2015  . Anterior neck pain 09/02/2015  . Papillary thyroid carcinoma ([place]) 09/02/2015  . Chest pain 10/22/2014  . Thyroid cancer ([place]) 10/22/2014  . Metastatic disease ([place]) 10/22/2014  . Essential hypertension 10/22/2014  . Elevated troponin   . Uncontrolled type 2 diabetes mellitus with hyperglycemia ([place]) 05/03/2014  . Hypothyroidism 05/03/2014  . History of thyroid cancer 05/03/2014  . Back muscle spasm 05/03/2014  . Chronic low back pain 05/03/2014  . Blurry vision 05/03/2014    Past Surgical History:  Procedure Laterality Date  . CESAREAN SECTION    . LEFT HEART CATHETERIZATION WITH CORONARY ANGIOGRAM N/A 10/23/2014   Procedure: LEFT HEART CATHETERIZATION WITH CORONARY ANGIOGRAM;  Surgeon: Larey Dresser, MD;  Location[facility] C[facility] LAB;  Service: Cardiovascular;  Laterality: N/A;  . THYROIDECTOMY      OB History    No data available       Home Medications    Prior to Admission medications   Medication Sig Start Date End Date Taking? Authorizing Provider  acetaminophen-codeine (TYLENOL #3) 300-30 MG tablet Take 1 tablet by mouth every 8 (eight) hours as needed for moderate pain. 09/02/15   Josalyn Funches, MD  amLODipine (NORVASC) 5 MG tablet Take 1 tablet (5 mg total) by mouth daily. 09/02/15   Boykin Nearing, MD  aspirin 81 MG chewable tablet Chew 1 tablet (81 mg total) by mouth daily. 10/24/14   Kelvin Cellar, MD  Blood Glucose Monitoring Suppl (TRUE METRIX METER) W/DEVICE KIT 1 each by Does not apply route as needed. 09/02/15   Josalyn Funches, MD  fluticasone (FLONASE) 50 MCG/ACT nasal spray Place 2 sprays into both nostrils daily. 12/17/14   Lorayne Marek, MD  gabapentin (NEURONTIN) 100 MG capsule Take 1 capsule (100 mg total) by mouth 3 (three) times daily. 09/02/15   Josalyn Funches, MD  glucose blood (TRUE METRIX BLOOD GLUCOSE TEST) test strip 1 each by Other route 3 (three) times daily. 09/02/15   Josalyn Funches, MD  ibuprofen (ADVIL,MOTRIN) 600 MG tablet Take 1 tablet (600 mg total) by mouth every 8 (eight) hours as needed. 02/26/15   Lorayne Marek, MD  insulin NPH-regular Human (NOVOLIN 70/30) (70-30) 100 UNIT/ML injection Inject 10 Units into the skin 2 (two) times  daily with a meal. 09/02/15   Boykin Nearing, MD  isosorbide mononitrate (IMDUR) 30 MG 24 hr tablet Take 1 tablet (30 mg total) by mouth daily. 10/24/14   Kelvin Cellar, MD  levothyroxine (SYNTHROID, LEVOTHROID) 300 MCG tablet Take 1 tablet (300 mcg total) by mouth daily before breakfast. Needs office visit for refills 03/17/16   Boykin Nearing, MD  losartan (COZAAR) 100 MG tablet Take 1 tablet (100 mg total) by mouth daily.  09/02/15   Boykin Nearing, MD  metFORMIN (GLUCOPHAGE) 1000 MG tablet Take 1 tablet (1,000 mg total) by mouth 2 (two) times daily with a meal. 09/02/15   Boykin Nearing, MD  moxifloxacin (AVELOX) 400 MG tablet Take 1 tablet (400 mg total) by mouth daily at 8 pm. 05/29/16 06/05/16  Ivin Booty, MD  ondansetron (ZOFRAN) 4 MG tablet Take 1 tablet (4 mg total) by mouth every 6 (six) hours as needed for nausea or vomiting. 05/29/16   Ivin Booty, MD  oxyCODONE-acetaminophen (PERCOCET/ROXICET) 5-325 MG tablet Take 2 tablets by mouth every 4 (four) hours as needed for severe pain. 12/09/15   Samantha Tripp Dowless, PA-C  TRUEPLUS LANCETS 28G MISC 1 each by Does not apply route 3 (three) times daily. 09/02/15   Boykin Nearing, MD    Family History Family History  Problem Relation Age of Onset  . Diabetes Mother   . Hypertension Mother   . Diabetes Sister   . Cancer Sister     Social History Social History  Substance Use Topics  . Smoking status: Never Smoker  . Smokeless tobacco: Never Used  . Alcohol use No     Allergies   Review of patient's allergies indicates no known allergies.   Review of Systems Review of Systems  Constitutional: Positive for chills, fatigue and fever.  HENT: Negative for ear pain and sore throat.   Eyes: Negative for visual disturbance.  Respiratory: Positive for cough and shortness of breath.   Cardiovascular: Negative for chest pain.  Gastrointestinal: Positive for abdominal pain, diarrhea, nausea and vomiting.  Genitourinary: Negative for dysuria, frequency, vaginal bleeding and vaginal discharge.  Musculoskeletal: Negative for neck pain and neck stiffness.  Skin: Negative for rash.  Allergic/Immunologic: Negative for immunocompromised state.  Neurological: Negative for headaches.  Hematological: Does not bruise/bleed easily.  All other systems reviewed and are negative.    Physical Exam Updated Vital Signs BP 110/77   Pulse 88   Temp 101.9 F  (38.8 C) (Oral)   Resp 16   Wt 52.2 kg   SpO2 99%   BMI 23.23 kg/m   Physical Exam  Constitutional: She appears well-developed and well-nourished. No distress.  HENT:  Head: Normocephalic and atraumatic.  Eyes: Conjunctivae are normal.  Neck: Neck supple.  Cardiovascular: Regular rhythm.  Tachycardia present.   No murmur heard. Pulmonary/Chest: Effort normal. No respiratory distress.  Coarse breath sounds bilat worse at bases  Abdominal: Soft. There is tenderness (epigastric area, upper abdomen, no r/g, soft).  Musculoskeletal: She exhibits no edema.  Neurological: She is alert.  Skin: Skin is warm and dry.  Psychiatric: She has a normal mood and affect.  Nursing note and vitals reviewed.    ED Treatments / Results  Labs (all labs ordered are listed, but only abnormal results are displayed) Labs Reviewed  URINE CULTURE - Abnormal; Notable for the following:       Result Value   Culture <10,000 COLONIES/mL INSIGNIFICANT GROWTH (*)    All other components within normal limits  COMPREHENSIVE METABOLIC  PANEL - Abnormal; Notable for the following:    Sodium 129 (*)    Potassium 3.0 (*)    Chloride 95 (*)    Glucose, Bld 389 (*)    Calcium 8.6 (*)    Albumin 2.9 (*)    All other components within normal limits  URINALYSIS, ROUTINE W REFLEX MICROSCOPIC (NOT AT [facility]) - Abnormal; Notable for the following:    Glucose, UA >1000 (*)    Hgb urine dipstick MODERATE (*)    Protein, ur 30 (*)    All other components within normal limits  CBC WITH DIFFERENTIAL/PLATELET - Abnormal; Notable for the following:    RBC 3.76 (*)    Hemoglobin 11.2 (*)    HCT 33.3 (*)    Lymphs Abs 0.6 (*)    All other components within normal limits  URINE MICROSCOPIC-ADD ON - Abnormal; Notable for the following:    Squamous Epithelial / LPF 0-5 (*)    All other components within normal limits  I-STAT VENOUS BLOOD GAS, ED - Abnormal; Notable for the following:    pH, Ven 7.434 (*)    pCO2, Ven  44.5 (*)    pO2, Ven 76.0 (*)    Bicarbonate 29.8 (*)    Acid-Base Excess 5.0 (*)    All other components within normal limits  CULTURE, BLOOD (ROUTINE X 2)  CULTURE, BLOOD (ROUTINE X 2)  LIPASE, BLOOD  BLOOD GAS, VENOUS  I-STAT CG4 LACTIC ACID, ED  I-STAT CG4 LACTIC ACID, ED    EKG  EKG Interpretation None       Radiology Dg Chest 2 View  Result Date: 05/29/2016 CLINICAL DATA:  Headache, fever, nausea, vomiting, diarrhea for 2 weeks. History of metastatic thyroid cancer. EXAM: CHEST  2 VIEW COMPARISON:  Chest radiograph December 09, 2015 FINDINGS: Worsening interstitial and nodular densities throughout the lungs. Cardiac silhouette is normal, mildly calcified aortic knob. Similar RIGHT lung pleural thickening. Surgical clips project at the RIGHT lung apex. no pneumothorax. Soft tissue planes and included osseous structures are nonsuspicious. IMPRESSION: Worsening interstitial prominence and nodular densities, concerning for metastasis progression and possible acute atypical infection. Electronically Signed   By: Elon Alas M.D.   On: 05/29/2016 22:51   Ct Abdomen Pelvis W Contrast  Result Date: 05/29/2016 CLINICAL DATA:  Epigastric pain and nausea, vomiting, and diarrhea for 2 weeks. Worsening with fever. EXAM: CT ABDOMEN AND PELVIS WITH CONTRAST TECHNIQUE: Multidetector CT imaging of the abdomen and pelvis was performed using the standard protocol following bolus administration of intravenous contrast. CONTRAST:  168m ISOVUE-300 IOPAMIDOL (ISOVUE-300) INJECTION 61% COMPARISON:  CT chest of the LS 10/21/2014 FINDINGS: Lower chest: Innumerable pulmonary nodules on the lung bases, largest measuring 11 mm in the left lower lobe. These appear unchanged in size from prior chest CT of January 2016, though some of the nodules were obscured previously by atelectasis. Focal airspace opacity in the lingula with air bronchograms, only partially included. Liver: No focal lesion. Focal fatty  infiltration adjacent with falciform ligament Hepatobiliary: Gallbladder physiologically distended, no calcified stone. No biliary dilatation. Pancreas: No ductal dilatation or inflammation. Spleen: Normal in size.  Cleft inferior medially. Adrenal glands: No nodule. Kidneys: Symmetric renal enhancement.  No hydronephrosis. Stomach/Bowel: Stomach is decompressed. There are no dilated or thickened small bowel loops. Small volume of stool throughout the colon without colonic wall thickening. There is enteric contrast throughout the entire colon including the rectum. There is high-density material most consistent with enteric contrast within the vagina with equivocal  rectal wall thickening. The appendix is normal. Vascular/Lymphatic: Previous perigastric adenopathy has diminished. Lymph node adjacent to the fundus currently measures 6 mm short axis, previously 10 mm. No retroperitoneal adenopathy. Abdominal aorta is normal in caliber. Reproductive: High-density material within the vagina most consistent with enteric contrast with a few foci of air. Uterus is normal in size. Ovaries are symmetric. No adnexal mass. Bladder: Minimally distended and not well evaluated. Other: No free air, free fluid, or intra-abdominal fluid collection. Small fat containing umbilical hernia. Musculoskeletal: There are no acute or suspicious osseous abnormalities. IMPRESSION: 1. Lingular consolidation with air bronchograms, concerning for pneumonia and may be cause of epigastric pain and fever. This is only partially included in the field of view, recommend chest radiograph to evaluate for extent 2. High-density material within the vagina concerning for enteric contrast, with small foci of air. This is concerning for rectovaginal fistula, however vaginal reflux of enteric contrast could produce a similar appearance. No associated inflammation, there is equivocal rectal wall thickening. 3. Innumerable pulmonary nodules at the lung bases,  largest 11 mm in the left lower lobe. These are stable from January 2016 CT and likely treated thyroid cancer. Electronically Signed   By: Jeb Levering M.D.   On: 05/29/2016 21:18    Procedures Procedures (including critical care time)  Medications Ordered in ED Medications  ondansetron (ZOFRAN-ODT) 4 MG disintegrating tablet (4 mg  Given 05/29/16 1428)  acetaminophen (TYLENOL) tablet 650 mg (650 mg Oral Given 05/29/16 1427)  sodium chloride 0.9 % bolus 1,000 mL (0 mLs Intravenous Stopped 05/29/16 1904)  diatrizoate meglumine-sodium (GASTROGRAFIN) 66-10 % solution (  Contrast Given 05/29/16 1700)  iopamidol (ISOVUE-300) 61 % injection (100 mLs  Contrast Given 05/29/16 2042)  potassium chloride SA (K-DUR,KLOR-CON) CR tablet 40 mEq (40 mEq Oral Given 05/29/16 1736)  diatrizoate meglumine-sodium (GASTROGRAFIN) 66-10 % solution (  Contrast Given 05/29/16 1830)  acetaminophen (TYLENOL) tablet 650 mg (650 mg Oral Given 05/29/16 2214)     Initial Impression / Assessment and Plan / ED Course  I have reviewed the triage vital signs and the nursing notes.  Pertinent labs & imaging results that were available during my care of the patient were reviewed by me and considered in my medical decision making (see chart for details).  Clinical Course    50 year old female with history of metastatic papillary thyroid carcinoma, not on chemotherapy or radiation, hypertension, diabetes presenting with fever, n/v/d, cough, SOB as above.    Here patient is febrile, tachycardic, otherwise hemodynamically stable. Improved with Tylenol. Workup notable for consolidation on chest x-ray consistent with pneumonia, worsening metastatic disease of the lung, and CT scan with possible rectovaginal fistula.  Given patient's abdominal pain, nausea, vomiting, diarrhea associated with this fever, surgery consulted for evaluation of possible fistula. They do not feel this is contributing to patient's acute presentation today  and do not feel requires any emergent treatment. Advised follow-up with gastroenterology.  Discussed finding of worsening metastatic disease of the lungs. She states she sees oncologist in [place]. She is not currently on chemotherapy or radiation. She has been told that her level of metastatic disease is stable. Notified of read that disease in the chest is worse. Advised follow-up with oncologist.  Hyperglycemic here likely secondary to acute infection with pneumonia, but without evidence of DKA. We'll treat for community acquired pneumonia with Avelox. Advised to follow up closely with PCP regarding hyperglycemia and acute pneumonia.   Patient verbalized understanding of plan for follow-up with PCP,  gastroenterology, and oncology. Strict return precautions given. She is feeling better and requesting discharge home. She was hydrated and tolerating by mouth. Will give antiemetics at home as well. Discharged in stable condition.  Case discussed with Dr. Angelina Pih, who oversaw management of this patient.   Final Clinical Impressions(s) / ED Diagnoses   Final diagnoses:  Community acquired pneumonia  Malignant neoplasm metastatic to lung, unspecified laterality ([place])  Rectovaginal fistula  Hyperglycemia    New Prescriptions Discharge Medication List as of 05/29/2016 11:28 PM    START taking these medications   Details  moxifloxacin (AVELOX) 400 MG tablet Take 1 tablet (400 mg total) by mouth daily at 8 pm., Starting Sat 05/29/2016, Until Sat 06/05/2016, Print    ondansetron (ZOFRAN) 4 MG tablet Take 1 tablet (4 mg total) by mouth every 6 (six) hours as needed for nausea or vomiting., Starting Sat 05/29/2016, Print         Ivin Booty, MD 05/31/16 1245    Elnora Morrison, MD 06/03/16 1359

## 2016-05-29 NOTE — Consult Note (Signed)
Reason for Consult: abdominal pain Referring Physician: Deaira, Leckey is an 50 y.o. female.  HPI: 50 yo female with 2 weeks of nausea, vomiting and abdominal pain. She denies travel, sick contacts, or new foods prior to this occurring. She also notes a 1 month severe cough. She has diarrhea up to 4 x daily which is described as black and liquid. She denies any stool coming from her vagina. She denies any rectal or vaginal pain.  Past Medical History:  Diagnosis Date  . Diabetes mellitus without complication ([place])   . Hypertension   . Metastatic disease ([place])   . Thyroid cancer [facility])     Past Surgical History:  Procedure Laterality Date  . CESAREAN SECTION    . LEFT HEART CATHETERIZATION WITH CORONARY ANGIOGRAM N/A 10/23/2014   Procedure: LEFT HEART CATHETERIZATION WITH CORONARY ANGIOGRAM;  Surgeon: Larey Dresser, MD;  Location: [facility] CATH LAB;  Service: Cardiovascular;  Laterality: N/A;  . THYROIDECTOMY      Family History  Problem Relation Age of Onset  . Diabetes Mother   . Hypertension Mother   . Diabetes Sister   . Cancer Sister     Social History:  reports that she has never smoked. She has never used smokeless tobacco. She reports that she does not drink alcohol. Her drug history is not on file.  Allergies: No Known Allergies  Medications: I have reviewed the patient's current medications.  Results for orders placed or performed during the hospital encounter of 05/29/16 (from the past 48 hour(s))  Lipase, blood     Status: None   Collection Time: 05/29/16  2:30 PM  Result Value Ref Range   Lipase 37 11 - 51 U/L  Comprehensive metabolic panel     Status: Abnormal   Collection Time: 05/29/16  2:30 PM  Result Value Ref Range   Sodium 129 (L) 135 - 145 mmol/L   Potassium 3.0 (L) 3.5 - 5.1 mmol/L   Chloride 95 (L) 101 - 111 mmol/L   CO2 27 22 - 32 mmol/L   Glucose, Bld 389 (H) 65 - 99 mg/dL   BUN 6 6 - 20 mg/dL   Creatinine, Ser 0.55 0.44 - 1.00 mg/dL   Calcium 8.6 (L) 8.9 - 10.3 mg/dL   Total Protein 6.7 6.5 - 8.1 g/dL   Albumin 2.9 (L) 3.5 - 5.0 g/dL   AST 27 15 - 41 U/L   ALT 17 14 - 54 U/L   Alkaline Phosphatase 123 38 - 126 U/L   Total Bilirubin 0.8 0.3 - 1.2 mg/dL   GFR calc non Af Amer >60 >60 mL/min   GFR calc Af Amer >60 >60 mL/min    Comment: (NOTE) The eGFR has been calculated using the CKD EPI equation. This calculation has not been validated in all clinical situations. eGFR's persistently <60 mL/min signify possible Chronic Kidney Disease.    Anion gap 7 5 - 15  CBC with Differential     Status: Abnormal   Collection Time: 05/29/16  2:30 PM  Result Value Ref Range   WBC 7.3 4.0 - 10.5 K/uL   RBC 3.76 (L) 3.87 - 5.11 MIL/uL   Hemoglobin 11.2 (L) 12.0 - 15.0 g/dL   HCT 33.3 (L) 36.0 - 46.0 %   MCV 88.6 78.0 - 100.0 fL   MCH 29.8 26.0 - 34.0 pg   MCHC 33.6 30.0 - 36.0 g/dL   RDW 12.2 11.5 - 15.5 %   Platelets 214 150 - 400  K/uL   Neutrophils Relative % 86 %   Neutro Abs 6.3 1.7 - 7.7 K/uL   Lymphocytes Relative 8 %   Lymphs Abs 0.6 (L) 0.7 - 4.0 K/uL   Monocytes Relative 6 %   Monocytes Absolute 0.5 0.1 - 1.0 K/uL   Eosinophils Relative 0 %   Eosinophils Absolute 0.0 0.0 - 0.7 K/uL   Basophils Relative 0 %   Basophils Absolute 0.0 0.0 - 0.1 K/uL  I-Stat CG4 Lactic Acid, ED     Status: None   Collection Time: 05/29/16  2:44 PM  Result Value Ref Range   Lactic Acid, Venous 1.34 0.5 - 1.9 mmol/L  Urinalysis, Routine w reflex microscopic     Status: Abnormal   Collection Time: 05/29/16  4:31 PM  Result Value Ref Range   Color, Urine YELLOW YELLOW   APPearance CLEAR CLEAR   Specific Gravity, Urine 1.024 1.005 - 1.030   pH 6.0 5.0 - 8.0   Glucose, UA >1000 (A) NEGATIVE mg/dL   Hgb urine dipstick MODERATE (A) NEGATIVE   Bilirubin Urine NEGATIVE NEGATIVE   Ketones, ur NEGATIVE NEGATIVE mg/dL   Protein, ur 30 (A) NEGATIVE mg/dL   Nitrite NEGATIVE NEGATIVE   Leukocytes, UA NEGATIVE NEGATIVE  Urine  microscopic-add on     Status: Abnormal   Collection Time: 05/29/16  4:31 PM  Result Value Ref Range   Squamous Epithelial / LPF 0-5 (A) NONE SEEN   WBC, UA 0-5 0 - 5 WBC/hpf   RBC / HPF 6-30 0 - 5 RBC/hpf   Bacteria, UA NONE SEEN NONE SEEN  I-Stat CG4 Lactic Acid, ED     Status: None   Collection Time: 05/29/16  5:57 PM  Result Value Ref Range   Lactic Acid, Venous 1.04 0.5 - 1.9 mmol/L  I-Stat venous blood gas, ED     Status: Abnormal   Collection Time: 05/29/16  5:57 PM  Result Value Ref Range   pH, Ven 7.434 (H) 7.250 - 7.300   pCO2, Ven 44.5 (L) 45.0 - 50.0 mmHg   pO2, Ven 76.0 (H) 31.0 - 45.0 mmHg   Bicarbonate 29.8 (H) 20.0 - 24.0 mEq/L   TCO2 31 0 - 100 mmol/L   O2 Saturation 95.0 %   Acid-Base Excess 5.0 (H) 0.0 - 2.0 mmol/L   Patient temperature HIDE    Sample type VENOUS     Dg Chest 2 View  Result Date: 05/29/2016 CLINICAL DATA:  Headache, fever, nausea, vomiting, diarrhea for 2 weeks. History of metastatic thyroid cancer. EXAM: CHEST  2 VIEW COMPARISON:  Chest radiograph December 09, 2015 FINDINGS: Worsening interstitial and nodular densities throughout the lungs. Cardiac silhouette is normal, mildly calcified aortic knob. Similar RIGHT lung pleural thickening. Surgical clips project at the RIGHT lung apex. no pneumothorax. Soft tissue planes and included osseous structures are nonsuspicious. IMPRESSION: Worsening interstitial prominence and nodular densities, concerning for metastasis progression and possible acute atypical infection. Electronically Signed   By: Elon Alas M.D.   On: 05/29/2016 22:51   Ct Abdomen Pelvis W Contrast  Result Date: 05/29/2016 CLINICAL DATA:  Epigastric pain and nausea, vomiting, and diarrhea for 2 weeks. Worsening with fever. EXAM: CT ABDOMEN AND PELVIS WITH CONTRAST TECHNIQUE: Multidetector CT imaging of the abdomen and pelvis was performed using the standard protocol following bolus administration of intravenous contrast. CONTRAST:   176m ISOVUE-300 IOPAMIDOL (ISOVUE-300) INJECTION 61% COMPARISON:  CT chest of the LS 10/21/2014 FINDINGS: Lower chest: Innumerable pulmonary nodules on the lung bases,  largest measuring 11 mm in the left lower lobe. These appear unchanged in size from prior chest CT of January 2016, though some of the nodules were obscured previously by atelectasis. Focal airspace opacity in the lingula with air bronchograms, only partially included. Liver: No focal lesion. Focal fatty infiltration adjacent with falciform ligament Hepatobiliary: Gallbladder physiologically distended, no calcified stone. No biliary dilatation. Pancreas: No ductal dilatation or inflammation. Spleen: Normal in size.  Cleft inferior medially. Adrenal glands: No nodule. Kidneys: Symmetric renal enhancement.  No hydronephrosis. Stomach/Bowel: Stomach is decompressed. There are no dilated or thickened small bowel loops. Small volume of stool throughout the colon without colonic wall thickening. There is enteric contrast throughout the entire colon including the rectum. There is high-density material most consistent with enteric contrast within the vagina with equivocal rectal wall thickening. The appendix is normal. Vascular/Lymphatic: Previous perigastric adenopathy has diminished. Lymph node adjacent to the fundus currently measures 6 mm short axis, previously 10 mm. No retroperitoneal adenopathy. Abdominal aorta is normal in caliber. Reproductive: High-density material within the vagina most consistent with enteric contrast with a few foci of air. Uterus is normal in size. Ovaries are symmetric. No adnexal mass. Bladder: Minimally distended and not well evaluated. Other: No free air, free fluid, or intra-abdominal fluid collection. Small fat containing umbilical hernia. Musculoskeletal: There are no acute or suspicious osseous abnormalities. IMPRESSION: 1. Lingular consolidation with air bronchograms, concerning for pneumonia and may be cause of  epigastric pain and fever. This is only partially included in the field of view, recommend chest radiograph to evaluate for extent 2. High-density material within the vagina concerning for enteric contrast, with small foci of air. This is concerning for rectovaginal fistula, however vaginal reflux of enteric contrast could produce a similar appearance. No associated inflammation, there is equivocal rectal wall thickening. 3. Innumerable pulmonary nodules at the lung bases, largest 11 mm in the left lower lobe. These are stable from January 2016 CT and likely treated thyroid cancer. Electronically Signed   By: Jeb Levering M.D.   On: 05/29/2016 21:18    Review of Systems  Constitutional: Negative for chills and fever.  HENT: Negative for hearing loss.   Eyes: Negative for blurred vision and double vision.  Respiratory: Positive for cough. Negative for hemoptysis.   Cardiovascular: Negative for chest pain and palpitations.  Gastrointestinal: Positive for abdominal pain, nausea and vomiting.  Genitourinary: Negative for dysuria and urgency.  Musculoskeletal: Negative for myalgias and neck pain.  Skin: Negative for itching and rash.  Neurological: Negative for dizziness, tingling and headaches.  Endo/Heme/Allergies: Does not bruise/bleed easily.  Psychiatric/Behavioral: Negative for depression and suicidal ideas.   Blood pressure 122/76, pulse 84, temperature 101.9 F (38.8 C), temperature source Oral, resp. rate (!) 28, weight 52.2 kg (115 lb), SpO2 100 %. Physical Exam  Nursing note and vitals reviewed. Constitutional: She is oriented to person, place, and time. She appears well-developed and well-nourished.  HENT:  Head: Normocephalic and atraumatic.  Eyes: Conjunctivae and EOM are normal. No scleral icterus.  Neck: Normal range of motion. Neck supple.  Cardiovascular: Normal rate and regular rhythm.   Respiratory: Effort normal and breath sounds normal. She has no wheezes. She has no  rales. She exhibits no tenderness.  GI: Soft. She exhibits no distension. There is no rebound and no guarding.  Diffuse pain to palpation  Musculoskeletal: Normal range of motion. She exhibits no edema.  Neurological: She is alert and oriented to person, place, and time.  Skin: Skin  is warm and dry.  Psychiatric: She has a normal mood and affect. Her behavior is normal.    Assessment/Plan: 50 yo female with nausea/vomiting/diarrhea and abdominal pain without leukocytosis or abnormality seen on CT except for concern for rectovaginal fistula. -Given she still has a uterus, a benign rectovaginal fistula would be very abnormal, and she has no symptoms -her symptoms are more consistent with a gastroenteritis and would recommend brat diet and antibiotics -no need for surgical follow up, GI follow up may be of benefit  Arta Bruce Kinsinger 05/29/2016, 11:09 PM

## 2016-05-30 LAB — URINE CULTURE: Culture: <10000 — AB

## 2016-06-03 LAB — CULTURE, BLOOD (ROUTINE X 2)
Culture: NO GROWTH
Culture: NO GROWTH

## 2016-06-04 MED FILL — ?LEVOTHYROXINE 112 MCG TAB: 112 | 30 days supply | Qty: 90 | Fill #0 | Status: TO

## 2016-06-04 MED FILL — ?METFORMIN HCL 1,000 MG TAB: 1000 | 30 days supply | Qty: 60 | Fill #2

## 2016-06-10 ENCOUNTER — Telehealth: Payer: Self-pay | Admitting: Family Medicine

## 2016-06-10 DIAGNOSIS — I10 Essential (primary) hypertension: Secondary | ICD-10-CM

## 2016-06-10 MED ORDER — LOSARTAN POTASSIUM 100 MG PO TABS: 100.0000 mg | ORAL_TABLET | Freq: Every day | ORAL | 0 refills | Status: DC

## 2016-06-10 NOTE — Telephone Encounter (Signed)
Patient need losartan

## 2016-06-10 NOTE — Telephone Encounter (Signed)
Refilled losartan x 30 days - patient needs office visit for any further refills.

## 2016-07-05 ENCOUNTER — Telehealth: Payer: Self-pay | Admitting: Family Medicine

## 2016-07-08 ENCOUNTER — Ambulatory Visit: Payer: Medicaid Other

## 2016-07-15 ENCOUNTER — Telehealth: Payer: Self-pay | Admitting: Family Medicine

## 2016-07-15 NOTE — Telephone Encounter (Signed)
Helene Kelp from [facility] Department calling in regards to pts Losartan Rx She was informed that in the chart it states pt needs OV to get refills due to pt not being seen since 2016 [facility] requesting to speak to nurse

## 2016-07-16 ENCOUNTER — Other Ambulatory Visit: Payer: Self-pay | Admitting: Family Medicine

## 2016-07-16 DIAGNOSIS — I10 Essential (primary) hypertension: Secondary | ICD-10-CM

## 2016-07-16 MED ORDER — LOSARTAN POTASSIUM 100 MG PO TABS: 100.0000 mg | ORAL_TABLET | Freq: Every day | ORAL | 1 refills | Status: DC

## 2016-07-16 NOTE — Telephone Encounter (Signed)
Done

## 2016-07-16 NOTE — Telephone Encounter (Signed)
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 Pt needs a refill on losartan, pt chart says she needs a OV before script can be refilled. The health department is going to help her get her medication, will you be able to refill her medication for Dr. Adrian Blackwater.

## 2016-07-20 ENCOUNTER — Telehealth: Payer: Self-pay | Admitting: Family Medicine

## 2016-07-20 NOTE — Telephone Encounter (Signed)
Cassandra from [facility] Department called to speak with nurse regarding pt's status and appt that she has tomorrow. It's really important that the call is returned. If she doesn't pick up then please leave a message.

## 2016-07-21 ENCOUNTER — Ambulatory Visit: Payer: Medicaid Other | Admitting: Family Medicine

## 2016-07-21 NOTE — Telephone Encounter (Signed)
Please call patient to cancel Also update Cassandra   Appointment to be rescheduled until no longer having active TB  Patient should follow up with health department for TB treatment and monitoring.

## 2016-09-03 ENCOUNTER — Ambulatory Visit
Admission: RE | Admit: 2016-09-03 | Discharge: 2016-09-03 | Disposition: A | Discharge status: Home / Self Care | Payer: No Typology Code available for payment source | Source: Ambulatory Visit | Attending: Infectious Disease | Admitting: Infectious Disease

## 2016-09-03 ENCOUNTER — Other Ambulatory Visit: Payer: Self-pay | Admitting: Infectious Disease

## 2016-09-03 DIAGNOSIS — R7611 Nonspecific reaction to tuberculin skin test without active tuberculosis: Secondary | ICD-10-CM

## 2016-09-20 ENCOUNTER — Ambulatory Visit: Payer: Medicaid Other | Admitting: Family Medicine

## 2017-02-16 ENCOUNTER — Encounter: Payer: Self-pay | Admitting: Family Medicine

## 2019-08-07 ENCOUNTER — Ambulatory Visit: Payer: Self-pay | Attending: Nurse Practitioner | Admitting: Nurse Practitioner

## 2019-08-07 ENCOUNTER — Encounter: Payer: Self-pay | Admitting: Nurse Practitioner

## 2019-08-07 ENCOUNTER — Other Ambulatory Visit: Payer: Self-pay

## 2019-08-07 ENCOUNTER — Ambulatory Visit (HOSPITAL_BASED_OUTPATIENT_CLINIC_OR_DEPARTMENT_OTHER): Payer: Self-pay | Admitting: Pharmacist

## 2019-08-07 VITALS — BP 160/97 | HR 76 | Temp 98.0°F | Resp 16 | Ht 59.0 in | Wt 147.6 lb

## 2019-08-07 DIAGNOSIS — I10 Essential (primary) hypertension: Secondary | ICD-10-CM

## 2019-08-07 DIAGNOSIS — C786 Secondary malignant neoplasm of retroperitoneum and peritoneum: Secondary | ICD-10-CM

## 2019-08-07 DIAGNOSIS — Z1321 Encounter for screening for nutritional disorder: Secondary | ICD-10-CM

## 2019-08-07 DIAGNOSIS — Z23 Encounter for immunization: Secondary | ICD-10-CM

## 2019-08-07 DIAGNOSIS — Z85850 Personal history of malignant neoplasm of thyroid: Secondary | ICD-10-CM

## 2019-08-07 DIAGNOSIS — E1165 Type 2 diabetes mellitus with hyperglycemia: Secondary | ICD-10-CM

## 2019-08-07 DIAGNOSIS — E1142 Type 2 diabetes mellitus with diabetic polyneuropathy: Secondary | ICD-10-CM

## 2019-08-07 LAB — POCT GLYCOSYLATED HEMOGLOBIN (HGB A1C): HbA1c, POC (controlled diabetic range): 7.2 % — AB (ref 0.0–7.0)

## 2019-08-07 LAB — GLUCOSE, POCT (MANUAL RESULT ENTRY): POC Glucose: 194 mg/dl — AB (ref 70–99)

## 2019-08-07 MED ORDER — TRUE METRIX METER W/DEVICE KIT: 1.0000 | PACK | 0 refills | Status: AC | PRN

## 2019-08-07 MED ORDER — TRUE METRIX BLOOD GLUCOSE TEST VI STRP: 1.0000 | ORAL_STRIP | Freq: Three times a day (TID) | 11 refills | Status: AC

## 2019-08-07 MED ORDER — LEVOTHYROXINE SODIUM 300 MCG PO TABS: 300.0000 ug | ORAL_TABLET | Freq: Every day | ORAL | 1 refills | Status: DC

## 2019-08-07 MED ORDER — GABAPENTIN 300 MG PO CAPS: 300.0000 mg | ORAL_CAPSULE | Freq: Every day | ORAL | 1 refills | Status: DC

## 2019-08-07 MED ORDER — LOSARTAN POTASSIUM 100 MG PO TABS: 100.0000 mg | ORAL_TABLET | Freq: Every day | ORAL | 1 refills | Status: DC

## 2019-08-07 MED ORDER — TRUEPLUS LANCETS 28G MISC: 1.0000 | Freq: Three times a day (TID) | 11 refills | Status: DC

## 2019-08-07 MED ORDER — METFORMIN HCL 1000 MG PO TABS: 1000.0000 mg | ORAL_TABLET | Freq: Two times a day (BID) | ORAL | 1 refills | Status: DC

## 2019-08-07 MED ORDER — AMLODIPINE BESYLATE 5 MG PO TABS: 5.0000 mg | ORAL_TABLET | Freq: Every day | ORAL | 1 refills | Status: DC

## 2019-08-07 MED FILL — LOSARTAN POTASSIUM 100 MG T: 100 | 30 days supply | Qty: 30 | Fill #0

## 2019-08-07 MED FILL — AMLODIPINE BESYLATE 5 MG TA: 5 | 30 days supply | Qty: 30 | Fill #0

## 2019-08-07 MED FILL — TRUE METRIX TEST STRIP: 30 days supply | Qty: 100 | Fill #0

## 2019-08-07 MED FILL — !TRUE METRIX BLOOD GLUCOSE: 1 days supply | Qty: 1 | Fill #0

## 2019-08-07 MED FILL — LEVOTHYROXINE 300 MCG TAB: 300 | 30 days supply | Qty: 30 | Fill #0

## 2019-08-07 MED FILL — GABAPENTIN 300 MG CAPSULE: 300 | 30 days supply | Qty: 30 | Fill #0

## 2019-08-07 MED FILL — TRUEplus LANCETS 28G MISC: 30 days supply | Qty: 100 | Fill #0

## 2019-08-07 MED FILL — metFORMIN HCL 1000 MG TABS: 1000 | 30 days supply | Qty: 60 | Fill #0

## 2019-08-07 NOTE — Progress Notes (Signed)
Assessment & Plan:  Jenny [name] was seen today for new patient (initial visit) and diabetes.  Diagnoses and all orders for this visit:  Uncontrolled type 2 diabetes mellitus with hyperglycemia ([place]) -     Microalbumin / creatinine urine ratio -     POCT glucose (manual entry) -     POCT glycosylated hemoglobin (Hb A1C) -     CBC -     CMP14+EGFR -     Lipid panel -     Fecal occult blood, imunochemical -     glucose blood (TRUE METRIX BLOOD GLUCOSE TEST) test strip; 1 each by Other route 3 (three) times daily. -     TRUEplus Lancets 28G MISC; 1 each by Does not apply route 3 (three) times daily. -     Blood Glucose Monitoring Suppl (TRUE METRIX METER) w/Device KIT; 1 each by Does not apply route as needed. -     metFORMIN (GLUCOPHAGE) 1000 MG tablet; Take 1 tablet (1,000 mg total) by mouth 2 (two) times daily with a meal. -     Ambulatory referral to Ophthalmology -     atorvastatin (LIPITOR) 20 MG tablet; Take 1 tablet (20 mg total) by mouth daily. Continue blood sugar control as discussed in office today, low carbohydrate diet, and regular physical exercise as tolerated, 150 minutes per week (30 min each day, 5 days per week, or 50 min 3 days per week). Keep blood sugar logs with fasting goal of 90-130 mg/dl, post prandial (after you eat) less than 180.  For Hypoglycemia: BS <60 and Hyperglycemia BS >400; contact the clinic ASAP. Annual eye exams and foot exams are recommended.   Essential hypertension -     losartan (COZAAR) 100 MG tablet; Take 1 tablet (100 mg total) by mouth daily. -     amLODipine (NORVASC) 5 MG tablet; Take 1 tablet (5 mg total) by mouth daily. Continue all antihypertensives as prescribed.  Remember to bring in your blood pressure log with you for your follow up appointment.  DASH/Mediterranean Diets are healthier choices for HTN.    Diabetic peripheral neuropathy associated with type 2 diabetes mellitus (HCC) -     gabapentin (NEURONTIN) 300 MG capsule; Take 1  capsule (300 mg total) by mouth at bedtime.  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    Patient has been counseled on age-appropriate routine health concerns for screening and prevention. These are reviewed and up-to-date. Referrals have been placed accordingly. Immunizations are up-to-date or declined.    Subjective:   Chief Complaint  Patient presents with  . New Patient (Initial Visit)  . Diabetes   HPI Jenny [name] 53 y.o. female presents to office today to re establish care.  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  She is accompanied today by an onsite interpreter.  She has been receiving health care in [place]. Moved back to [place] 2 months ago. Her 40 year old son died this year. He had cystic fibrosis. She is very tearful today however declines SSRI or appointment  with LCSW. She states she has good support from her family and that is why she moved back to [facility]. She has not been a patient at this clinic in 3 years  Referred to [facility] for mammogram scheduling/scholarship   DM TYPE 2 Well controlled. Will continue metformin 1000 mg BID. We discussed dietary and exercise modifications today. She has hyperglycemic symptoms of peripheral neuropathy. She denies any symptoms of hypoglycemia.  Lab Results  Component Value Date   HGBA1C 7.2 (A) 08/07/2019   Lab Results  Component Value Date   HGBA1C 12.90 09/02/2015    Papillary Thyroid CA She was diagnosed in 05/2007 by biopsy however due in part to the medical problems of her son with  cystic fibrosis she did not get a thyroidectomy until 03/2011.  At that time she had metastatic disease (lung, RP) and underwent bilateral neck dissection. Afterwards she was treated with radioactive iodine by Dr. Lorenda Peck. In 12/2011 she had recurrence in the thyroid bed and underwent re-resection and was again treated with radioactive iodine. Per records she received another dose in 10/2012 however the patient denies this and says definitively that she was only treated twice. In May 2014 she had a CT which showed innumerable pulmonary nodules. She has a history of non adherence with her synthroid.   Lab Results  Component Value Date   TSH 0.289 (L) 09/16/2015    Essential Hypertension She has not taken her blood pressure medication this morning. She is only taking Losartan 100 mg daily. Previous medications include Imdur 30 mg daily and amlodipine 5 mg daily.  She does monitor her blood pressure with average systolic readings: 144-315Q. Will refill losartan and amlodipine 5 mg daily today. Denies chest pain, shortness of breath, palpitations, lightheadedness, dizziness, headaches or BLE edema.  BP Readings from Last 3 Encounters:  08/07/19 (!) 160/97  05/29/16 110/77  12/09/15 168/95    Review of Systems  Constitutional: Negative for fever, malaise/fatigue and weight loss.  HENT: Negative.  Negative for nosebleeds.   Eyes: Negative.  Negative for blurred vision, double vision and photophobia.  Respiratory: Negative.  Negative for cough and shortness of breath.   Cardiovascular: Negative.  Negative for chest pain, palpitations and leg swelling.  Gastrointestinal: Negative.  Negative for heartburn, nausea and vomiting.  Musculoskeletal: Negative.  Negative for myalgias.  Neurological: Negative.  Negative for dizziness, focal weakness, seizures and headaches.  Psychiatric/Behavioral: Negative.  Negative for suicidal ideas.    Past Medical History:  Diagnosis Date  . Diabetes mellitus  without complication ([place])   . Hypertension   . Metastatic disease ([place])   . Papillary thyroid carcinoma ([place]) 2008    Past Surgical History:  Procedure Laterality Date  . CESAREAN SECTION    . LEFT HEART CATHETERIZATION WITH CORONARY ANGIOGRAM N/A 10/23/2014   Procedure: LEFT HEART CATHETERIZATION WITH CORONARY ANGIOGRAM;  Surgeon: Larey Dresser, MD;  Location: [facility] CATH LAB;  Service: Cardiovascular;  Laterality: N/A;  . THYROIDECTOMY  03/2011    Family History  Problem Relation Age of Onset  . Diabetes Mother   . Hypertension Mother   . Diabetes Sister   . Cancer Sister     Social History Reviewed with no changes to be made today.   Outpatient Medications Prior to Visit  Medication Sig Dispense Refill  . amLODipine (NORVASC) 5 MG tablet Take 1 tablet (5 mg total) by mouth daily. 30 tablet 3  . isosorbide mononitrate (IMDUR) 30 MG 24 hr tablet Take 1 tablet (30  mg total) by mouth daily. 30 tablet 2  . levothyroxine (SYNTHROID, LEVOTHROID) 300 MCG tablet Take 1 tablet (300 mcg total) by mouth daily before breakfast. Needs office visit for refills 30 tablet 0  . losartan (COZAAR) 100 MG tablet Take 1 tablet (100 mg total) by mouth daily. 30 tablet 1  . metFORMIN (GLUCOPHAGE) 1000 MG tablet Take 1 tablet (1,000 mg total) by mouth 2 (two) times daily with a meal. 60 tablet 3  . acetaminophen-codeine (TYLENOL #3) 300-30 MG tablet Take 1 tablet by mouth every 8 (eight) hours as needed for moderate pain. (Patient not taking: Reported on 08/07/2019) 30 tablet 0  . aspirin 81 MG chewable tablet Chew 1 tablet (81 mg total) by mouth daily. (Patient not taking: Reported on 08/07/2019) 30 tablet 1  . Blood Glucose Monitoring Suppl (TRUE METRIX METER) W/DEVICE KIT 1 each by Does not apply route as needed. (Patient not taking: Reported on 08/07/2019) 1 kit 0  . fluticasone (FLONASE) 50 MCG/ACT nasal spray Place 2 sprays into both nostrils daily. (Patient not taking: Reported on 08/07/2019) 16 g 3  .  gabapentin (NEURONTIN) 100 MG capsule Take 1 capsule (100 mg total) by mouth 3 (three) times daily. (Patient not taking: Reported on 08/07/2019) 90 capsule 1  . glucose blood (TRUE METRIX BLOOD GLUCOSE TEST) test strip 1 each by Other route 3 (three) times daily. (Patient not taking: Reported on 08/07/2019) 100 each 11  . ibuprofen (ADVIL,MOTRIN) 600 MG tablet Take 1 tablet (600 mg total) by mouth every 8 (eight) hours as needed. (Patient not taking: Reported on 08/07/2019) 30 tablet 1  . insulin NPH-regular Human (NOVOLIN 70/30) (70-30) 100 UNIT/ML injection Inject 10 Units into the skin 2 (two) times daily with a meal. (Patient not taking: Reported on 08/07/2019) 10 mL 11  . ondansetron (ZOFRAN) 4 MG tablet Take 1 tablet (4 mg total) by mouth every 6 (six) hours as needed for nausea or vomiting. (Patient not taking: Reported on 08/07/2019) 12 tablet 0  . oxyCODONE-acetaminophen (PERCOCET/ROXICET) 5-325 MG tablet Take 2 tablets by mouth every 4 (four) hours as needed for severe pain. (Patient not taking: Reported on 08/07/2019) 20 tablet 0  . TRUEPLUS LANCETS 28G MISC 1 each by Does not apply route 3 (three) times daily. (Patient not taking: Reported on 08/07/2019) 100 each 11   No facility-administered medications prior to visit.     No Known Allergies     Objective:    BP (!) 160/97   Pulse 76   Temp 98 F (36.7 C) (Oral)   Resp 16   Ht '4\' 11"'  (1.499 m)   Wt 147 lb 9.6 oz (67 kg)   SpO2 97%   BMI 29.81 kg/m  Wt Readings from Last 3 Encounters:  08/07/19 147 lb 9.6 oz (67 kg)  05/29/16 115 lb (52.2 kg)  09/16/15 137 lb 6.4 oz (62.3 kg)    Physical Exam Vitals signs and nursing note reviewed.  Constitutional:      Appearance: She is well-developed.  HENT:     Head: Normocephalic and atraumatic.  Neck:     Musculoskeletal: Normal range of motion.  Cardiovascular:     Rate and Rhythm: Normal rate and regular rhythm.     Heart sounds: Normal heart sounds. No murmur. No friction rub.  No gallop.   Pulmonary:     Effort: Pulmonary effort is normal. No tachypnea or respiratory distress.     Breath sounds: Normal breath sounds. No decreased breath sounds, wheezing,  rhonchi or rales.  Chest:     Chest wall: No tenderness.  Abdominal:     General: Bowel sounds are normal.     Palpations: Abdomen is soft.  Musculoskeletal: Normal range of motion.  Skin:    General: Skin is warm and dry.  Neurological:     Mental Status: She is alert and oriented to person, place, and time.     Coordination: Coordination normal.  Psychiatric:        Behavior: Behavior normal. Behavior is cooperative.        Thought Content: Thought content normal.        Judgment: Judgment normal.          Patient has been counseled extensively about nutrition and exercise as well as the importance of adherence with medications and regular follow-up. The patient was given clear instructions to go to ER or return to medical center if symptoms don't improve, worsen or new problems develop. The patient verbalized understanding.   Follow-up: Return in about 2 weeks (around 08/21/2019) for BP recheck with Lurena Joiner then 3 months with me. Also needs Financial formsl .   Gildardo Pounds, FNP-BC [facility] and [place], [place]   08/08/2019, 9:22 PM

## 2019-08-07 NOTE — Patient Instructions (Signed)
Vacuna Td (contra el ttanos y la difteria): lo que debe saber Td Vaccine (Tetanus and Diphtheria): What You Need to Know 1. Por qu vacunarse? El ttanos y la difteria son enfermedades muy graves. Son Futures trader frecuentes en los [place] actualmente, pero las personas que se infectan suelen tener complicaciones graves. La vacuna Td se [place] para proteger a los adolescentes y a los adultos de ambas enfermedades. Tanto el ttanos como la difteria son infecciones causadas por bacterias. La difteria se transmite de persona a persona a travs de la tos o el estornudo. Las bacterias que causan el ttanos entran en el cuerpo a travs de cortes, raspones o heridas. El TTANOS (trismo) provoca entumecimiento y Writer dolorosa de los msculos, por lo general, en todo el cuerpo.  Puede causar el endurecimiento de los msculos de la cabeza y el cuello, de modo que impide abrir la boca, tragar y, en algunos casos, incluso respirar. El ttanos es causa de muerte en aproximadamente 1de cada 10personas que contraen la infeccin, incluso despus de que reciben la mejor atencin mdica. La DIFTERIA puede hacer que se forme una membrana gruesa en la parte posterior de la garganta.  Puede causar problemas respiratorios, parlisis, insuficiencia cardaca e incluso la muerte. Antes de las vacunas, en los [place] se informaban 200000 casos de difteria y cientos de casos de ttanos cada ao. Desde que comenz la vacunacin, los informes de casos de ambas enfermedades se han reducido en un 99%. 2. Edward Jolly Td La vacuna Td puede proteger a adolescentes y adultos contra el ttanos y la difteria. La vacuna Td habitualmente se aplica como dosis de refuerzo cada 10aos, pero tambin puede administrarse antes si la persona sufre una [place] o herida sucia y grave. A veces, en lugar de la vacuna Td, se recomienda una vacuna llamada Tdap, que protege contra la tos [place], adems de proteger contra el ttanos y la  difteria. El mdico o la persona que le aplique la vacuna puede darle ms informacin al [employer]. La vacuna Td puede administrarse de manera segura simultneamente con otras vacunas. 3. Algunas personas no deben recibir esta vacuna  Una persona que alguna vez ha tenido una reaccin alrgica potencialmente mortal a una dosis anterior de cualquier vacuna contra el ttanos o la difteria, O que tenga una alergia grave a cualquier parte de esta vacuna, no debe recibir la vacuna Td. Informe a la persona que le aplica la vacuna si tiene cualquier alergia grave.  Consulte con su mdico si: ? tuvo hinchazn o dolor intenso despus de recibir cualquier vacuna contra la difteria o el ttanos, ? alguna vez ha sufrido el sndrome de Curator (SGB), ? no se siente Pharmacologist en que se ha programado la vacuna. 4. R[place]gos de [place] reaccin a la vacuna Con cualquier medicamento, incluso las vacunas, existe la posibilidad de que aparezcan efectos secundarios. Suelen ser leves y desaparecen por s solos. Si bien es posible tener [place] graves, estas son Orlene Erm raras. [place] personas a las que se les aplica la vacuna Td no tienen ningn problema. Problemas leves despus de la vacuna Td: (No interfirieron en las actividades)  Management consultant donde se aplic la vacuna (alrededor de 8de cada 10personas)  Enrojecimiento o Estate agent donde se aplic la vacuna (alrededor de 1de cada 4personas)  Cristy Hilts leve (poco frecuente)  Dolor de Pensions consultant (alrededor de 1de cada 4personas)  Cansancio (alrededor de 1de cada 4personas) Problemas moderados despus de la vacuna  Td: (Interfirieron en las actividades, pero no exigieron atencin mdica)  Fiebre superior a 102F (39C) (poco frecuente) Problemas graves despus de la vacuna Td: (Impidieron realizar las actividades habituales y exigieron atencin mdica)  Hinchazn, dolor intenso, sangrado o enrojecimiento en el brazo en que se  aplic la vacuna (poco frecuente). Problemas que podran ocurrir despus de cualquier vacuna:  A veces, las personas se desmayan despus de un procedimiento mdico, incluida la vacunacin. Permanecer sentado o recostado durante 15minutos puede ayudar a evitar los desmayos y las lesiones causadas por las cadas. Informe al mdico si se siente mareado, tiene cambios en la visin o zumbidos en los odos.  Algunas personas sienten un dolor intenso en el hombro y tienen dificultad para mover el brazo donde se aplic la vacuna. Esto sucede con muy poca frecuencia.  Cualquier medicamento puede causar una reaccin alrgica grave. Dichas reacciones son muy poco frecuentes con una vacuna (se calcula que menos de 1en un milln de dosis) y se producen entre unos minutos y unas horas despus de la vacunacin. Al igual que con cualquier medicamento, existe una probabilidad muy remota de que una vacuna cause una lesin grave o la muerte. La seguridad de las vacunas se controla permanentemente. Para obtener ms informacin, visite: www.cdc.gov/vaccinesafety/ 5. Qu pasa si hay una reaccin grave? A qu signos debo estar atento?  Est atento a la aparicin de signos preocupantes, como los de una reaccin alrgica grave, fiebre muy alta o comportamiento fuera de lo normal. Los signos de una reaccin alrgica grave pueden incluir ronchas, hinchazn de la cara y la garganta, dificultad para respirar, latidos cardacos acelerados, mareos y debilidad. Generalmente, estos comienzan entre unos pocos minutos y algunas horas despus de la vacunacin. Qu debo hacer?  Si usted piensa que se trata de una reaccin alrgica grave o de otra emergencia que no puede esperar, llame al 9-1-1 o dirjase al hospital ms cercano. De lo contrario, llame al mdico.  Despus, la reaccin debe informarse al Sistema de Informe de Eventos Adversos de Vacunas (Vaccine Adverse Event Reporting System, VAERS). El mdico puede presentar  este informe, o bien puede hacerlo usted mismo a travs del sitio web de VAERS, en www.vaers.hhs.gov, o llamando al 1-800-822-7967. El Sistema de Informe de Eventos Adversos de Vacunas no brinda recomendaciones mdicas. 6. Programa Nacional de Compensacin de Daos por Vacunas El Programa Nacional de Compensacin de Daos por Vacunas (National Vaccine Injury Compensation Program, VICP) es un programa federal que fue creado para compensar a las personas que puedan haber sufrido daos al recibir ciertas vacunas. Aquellas personas que consideren que han sufrido un dao como consecuencia de una vacuna y quieran saber ms acerca del programa y de cmo presentar un reclamo, pueden llamar al 1-800-338-2382 o visitar el sitio web del VICP en www.hrsa.gov/vaccinecompensation. Hay un lmite de tiempo para presentar un reclamo de compensacin. 7. Cmo puedo obtener ms informacin?  Consulte a su mdico. Este puede darle el prospecto de la vacuna o recomendarle otras fuentes de informacin.  Comunquese con el servicio de salud de su localidad o su estado.  Comunquese con los Centers for Disease Control and Prevention, CDC (Centros para el Control y la Prevencin de Enfermedades): ? Llame al 1-800-232-4636 (1-800-CDC-INFO) ? Visite el sitio web de los CDC en www.cdc.gov/vaccines Declaracin de informacin sobre la vacuna Td (08/07/2016) Esta informacin no tiene como fin reemplazar el consejo del mdico. Asegrese de hacerle al mdico cualquier pregunta que tenga. Document Released: 01/06/2009 Document Revised: 05/24/2018 Document Reviewed: 05/24/2018   Chartered certified accountant Patient Education  2020 [place] antineumoccica de polisacridos (PPSV23): lo que debe saber Pneumococcal Polysaccharide Vaccine (PPSV23): What You Need to Know 1. Por qu vacunarse? La vacuna antineumoccica de polisacridos (PPSV23) puede prevenir la enfermedad neumoccica. Enfermedad neumoccica hace referencia a  cualquier enfermedad causada por bacterias neumoccicas. Estas bacterias pueden causar muchos tipos de enfermedades, entre ellas neumona, que es una infeccin de los pulmones. Las bacterias neumoccicas son Ardelia Mems de las causas ms comunes de la neumona. Adems de neumona, las bacterias neumoccicas tambin pueden causar lo siguiente:  Infecciones en los odos  Infecciones de los senos paranasales  Meningitis (infeccin del tejido que recubre el cerebro y la mdula espinal)  Bacteriemia (infeccin en el torrente sanguneo) Cualquier persona puede contraer la enfermedad neumoccica, pero los nios menores de 2 aos, las personas con [employer], los adultos mayores de 31 aos y los fumadores son los que corren un mayor riesgo. La mayora de las infecciones neumoccicas son leves. Sin embargo, algunas pueden causar problemas a largo plazo, como dao cerebral o prdida de la audicin. La meningitis, la bacteriemia y la neumona causadas por la enfermedad neumoccica pueden ser mortales. 2. PPSV23 La vacuna PPSV23 protege contra 23 tipos de bacterias que causan la enfermedad neumoccica. La vacuna PPSV23 se recomienda para las siguientes personas:  Todos los adultos de 65 aos o ms.  Cualquier persona de 2 aos o ms con ciertas enfermedades que pueden provocar un mayor riesgo de enfermedad neumoccica. [place] solo necesitan una dosis de la vacuna PPSV23. Para ciertos grupos de alto riesgo, se recomienda una segunda dosis de PPSV23 y otro tipo de vacuna antineumoccica llamada PCV13. Su mdico puede darle ms informacin. Las [employer] de 65 aos deben recibir una dosis de la vacuna PPSV23, aun si ya recibieron una o ms dosis antes de [employer]. 3. Hable con el mdico Comunquese con la persona que le coloca las vacunas si la persona que la recibe:  Ha tenido una reaccin alrgica despus de Ardelia Mems dosis previa de la vacuna PPSV23 o tiene alguna  alergia grave, potencialmente mortal. En algunos casos, es posible que el mdico decida posponer la aplicacin de la vacuna PPSV23 para una visita en el futuro. Las personas que sufren trastornos menores, como un resfro, pueden vacunarse. Las personas que tienen enfermedades moderadas o graves generalmente deben esperar hasta recuperarse para poder vacunarse con la PPSV23. Su mdico puede darle ms informacin. 4. [place]gos de [place] reaccin a la vacuna  Puede tener enrojecimiento o Management consultant de la inyeccin, cansancio, fiebre o dolores musculares despus de recibir la vacuna PPSV23. Las personas a veces se desmayan despus de procedimientos mdicos, incluida la vacunacin. Informe al mdico si se siente mareado, tiene cambios en la visin o zumbidos en los odos. Al igual que con cualquier [employer], existe una probabilidad muy remota de que una vacuna cause una reaccin alrgica grave, otra lesin grave o la muerte. 5. Qu pasa si se presenta un problema grave? Podra producirse una reaccin alrgica despus de que la persona vacunada abandone la clnica. Si observa signos de Nurse, mental health grave (ronchas, hinchazn de la cara y la garganta, dificultad para respirar, latidos cardacos acelerados, mareos o debilidad), llame al 9-1-1 y lleve a la persona al hospital ms cercano. Si se presentan otros signos que le preocupan, comunquese con su mdico. Las reacciones adversas deben informarse al Sistema de Informe de Eventos Adversos de [place] (Vaccine Adverse  Event Reporting System, VAERS). Por lo general, el mdico presenta este informe o puede hacerlo usted mismo. Visite el sitio web del VAERS en www.vaers.SamedayNews.es o llame al 346-777-6448.El VAERS es solo para Electrical engineer; su personal no proporciona asesoramiento mdico. 6. Cmo puedo obtener ms informacin?  Pregntele a su mdico.  Comunquese con el servicio de salud de su localidad o su estado.  Comunquese con los  Centros para el Control y la Prevencin de Probation officer for Disease Control and Prevention, CDC): ? Llame al 804-466-0061 (1-800-CDC-INFO) o ? Visite el sitio web de los CDC en http://hunter.com/ Declaracin de informacin de vacunas de los CDC sobre la vacuna PPSV23 (08/02/2018) Esta informacin no tiene [name] el consejo del mdico. Asegrese de hacerle al mdico cualquier pregunta que tenga. Document Released: 12/17/2008 Document Revised: 08/11/2018 Document Reviewed: 05/02/2018 Elsevier Patient Education  [place].   Influenza Virus Vaccine injection (Fluarix) Qu es este medicamento? La VACUNA ANTIGRIPAL ayuda a disminuir el riesgo de contraer la influenza, tambin conocida como la gripe. La vacuna solo ayuda a protegerle contra algunas cepas de influenza. Esta vacuna no ayuda a reducir Catering manager de contraer influenza pandmica H1N1. Este medicamento puede ser utilizado para otros usos; si tiene alguna pregunta consulte con su proveedor de atencin mdica o con su farmacutico. MARCAS COMUNES: Fluarix, Fluzone Qu le debo informar a mi profesional de la salud antes de tomar este medicamento? Necesita saber si usted presenta alguno de los siguientes problemas o situaciones:  trastorno de sangrado como hemofilia  fiebre o infeccin  sndrome de Guillain-Barre u otros problemas neurolgicos  problemas del sistema inmunolgico  infeccin por el virus de la inmunodeficiencia humana (VIH) o SIDA  niveles bajos de plaquetas en la sangre  esclerosis mltiple  una reaccin IT consultant o inusual a las vacunas antigripales, a los huevos, protenas de pollo, al ltex, a la gentamicina, a otros medicamentos, alimentos, colorantes o conservantes  si est embarazada o buscando quedar embarazada  si est amamantando a un beb Cmo debo [employer]? Esta vacuna se administra mediante inyeccin por va intramuscular. Lo administra un  profesional de [employer]	[place]. Recibir una copia de informacin escrita sobre la vacuna antes de cada vacuna. Asegrese de leer este folleto cada vez cuidadosamente. Este folleto puede cambiar con frecuencia. Hable con su pediatra para informarse acerca del uso de este medicamento en nios. Puede requerir atencin especial. Sobredosis: Pngase en contacto inmediatamente con un centro toxicolgico o una sala de urgencia si usted cree que haya tomado demasiado medicamento. ATENCIN: [employer] es solo para usted. No comparta este medicamento con nadie. Qu sucede si me olvido de una dosis? No se aplica en este caso. Qu puede interactuar con este medicamento?  quimioterapia o radioterapia  medicamentos que suprimen el sistema inmunolgico, tales como etanercept, anakinra, infliximab y adalimumab  medicamentos que tratan o previenen cogulos sanguneos, como warfarina  fenitona  medicamentos esteroideos, como la prednisona o la cortisona  teofilina  vacunas Puede ser que esta lista no menciona todas las posibles interacciones. Informe a su profesional de [employer]	[place] de [employer] productos a base de hierbas, medicamentos de [place] o suplementos nutritivos que est tomando. Si usted fuma, consume bebidas alcohlicas o si utiliza drogas ilegales, indqueselo tambin a su profesional de [employer]	[place]. Algunas sustancias pueden interactuar con su medicamento. A qu debo estar atento al usar [employer]? Informe a su mdico o a Barrister's clerk de [employer] todos los efectos secundarios que [place]  despus de 3 das. Llame a su proveedor de atencin mdica si se presentan sntomas inusuales dentro de las 6 semanas posteriores a la vacunacin. Es posible que todava pueda contraer la gripe, pero la enfermedad no ser tan fuerte como normalmente. No puede contraer la gripe de esta vacuna. La vacuna antigripal no le protege contra resfros u otras enfermedades que pueden causar [place]. Debe  vacunarse cada ao. Qu efectos secundarios puedo tener al [employer] este medicamento? Efectos secundarios que debe informar a su mdico o a Barrister's clerk de la salud tan pronto como sea posible:  Chief of Staff como erupcin cutnea, picazn o urticarias, hinchazn de la cara, labios o lengua Efectos secundarios que, por lo general, no requieren atencin mdica (debe informarlos a su mdico o a su profesional de la salud si persisten o si son molestos):  fiebre  dolor de cabeza  molestias y Forensic psychologist, sensibilidad, enrojecimiento o Estate agent de la inyeccin  cansancio o debilidad Puede ser que esta lista no menciona todos los posibles efectos secundarios. Comunquese a su mdico por asesoramiento mdico [employer]. Usted puede informar los efectos secundarios a la FDA por telfono al 1-800-FDA-1088. Dnde debo guardar mi medicina? Esta vacuna se administra solamente en clnicas, farmacias, consultorio mdico u otro consultorio de un profesional de la salud y no Sports coach en su domicilio. ATENCIN: Este folleto es un resumen. Puede ser que no cubra toda la posible informacin. Si usted tiene preguntas acerca de esta medicina, consulte con su mdico, su farmacutico o su profesional de Technical sales engineer.  2020 Elsevier/Gold Standard (2010-03-24 15:31:40)

## 2019-08-07 NOTE — Progress Notes (Signed)
Patient presents for vaccination against influenza, strep pneumo, and tetanus per orders of Zelda. Consent given. Counseling provided. No contraindications exists. Vaccine administered without incident.

## 2019-08-08 ENCOUNTER — Encounter: Payer: Self-pay | Admitting: Nurse Practitioner

## 2019-08-08 LAB — CMP14+EGFR
ALT: 14 IU/L (ref 0–32)
AST: 16 IU/L (ref 0–40)
Albumin/Globulin Ratio: 1.7 (ref 1.2–2.2)
Albumin: 4.7 g/dL (ref 3.8–4.9)
Alkaline Phosphatase: 136 IU/L — ABNORMAL HIGH (ref 39–117)
BUN/Creatinine Ratio: 17 (ref 9–23)
BUN: 11 mg/dL (ref 6–24)
Bilirubin Total: 0.4 mg/dL (ref 0.0–1.2)
CO2: 27 mmol/L (ref 20–29)
Calcium: 9.5 mg/dL (ref 8.7–10.2)
Chloride: 100 mmol/L (ref 96–106)
Creatinine, Ser: 0.65 mg/dL (ref 0.57–1.00)
GFR calc Af Amer: 116 mL/min/{1.73_m2} (ref >59)
GFR calc non Af Amer: 100 mL/min/{1.73_m2} (ref >59)
Globulin, Total: 2.8 g/dL (ref 1.5–4.5)
Glucose: 190 mg/dL — ABNORMAL HIGH (ref 65–99)
Potassium: 4 mmol/L (ref 3.5–5.2)
Sodium: 141 mmol/L (ref 134–144)
Total Protein: 7.5 g/dL (ref 6.0–8.5)

## 2019-08-08 LAB — MICROALBUMIN / CREATININE URINE RATIO
Creatinine, Urine: 19 mg/dL
Microalb/Creat Ratio: 506 mg/g creat — ABNORMAL HIGH (ref 0–29)
Microalbumin, Urine: 96.1 ug/mL

## 2019-08-08 LAB — CBC
Hematocrit: 40.5 % (ref 34.0–46.6)
Hemoglobin: 14 g/dL (ref 11.1–15.9)
MCH: 32.8 pg (ref 26.6–33.0)
MCHC: 34.6 g/dL (ref 31.5–35.7)
MCV: 95 fL (ref 79–97)
Platelets: 196 10*3/uL (ref 150–450)
RBC: 4.27 x10E6/uL (ref 3.77–5.28)
RDW: 11.9 % (ref 11.7–15.4)
WBC: 6.9 10*3/uL (ref 3.4–10.8)

## 2019-08-08 LAB — THYROID PANEL WITH TSH
Free Thyroxine Index: 1.2 (ref 1.2–4.9)
T3 Uptake Ratio: 22 % — ABNORMAL LOW (ref 24–39)
T4, Total: 5.4 ug/dL (ref 4.5–12.0)
TSH: 34.3 u[IU]/mL — ABNORMAL HIGH (ref 0.450–4.500)

## 2019-08-08 LAB — LIPID PANEL
Chol/HDL Ratio: 6.6 ratio — ABNORMAL HIGH (ref 0.0–4.4)
Cholesterol, Total: 244 mg/dL — ABNORMAL HIGH (ref 100–199)
HDL: 37 mg/dL — ABNORMAL LOW (ref >39)
LDL Chol Calc (NIH): 168 mg/dL — ABNORMAL HIGH (ref 0–99)
Triglycerides: 210 mg/dL — ABNORMAL HIGH (ref 0–149)
VLDL Cholesterol Cal: 39 mg/dL (ref 5–40)

## 2019-08-08 LAB — VITAMIN D 25 HYDROXY (VIT D DEFICIENCY, FRACTURES): Vit D, 25-Hydroxy: 25.2 ng/mL — ABNORMAL LOW (ref 30.0–100.0)

## 2019-08-08 MED ORDER — ATORVASTATIN CALCIUM 20 MG PO TABS: 20.0000 mg | ORAL_TABLET | Freq: Every day | ORAL | 3 refills | Status: DC

## 2019-08-09 MED FILL — ATORVASTATIN CALCIUM 20 MG: 20 | 30 days supply | Qty: 30 | Fill #0

## 2019-08-13 ENCOUNTER — Telehealth: Payer: Self-pay

## 2019-08-13 ENCOUNTER — Ambulatory Visit: Payer: Self-pay

## 2019-08-13 NOTE — Telephone Encounter (Signed)
[employer] interpreters Santiago Glad  Id#  [id number] contacted pt to go over lab results pt didn't answer lvm asking pt to give me a call at her earliest convenience

## 2019-08-21 ENCOUNTER — Ambulatory Visit: Payer: Self-pay | Admitting: Pharmacist

## 2019-09-19 ENCOUNTER — Other Ambulatory Visit (HOSPITAL_COMMUNITY): Payer: Self-pay | Admitting: *Deleted

## 2019-09-19 DIAGNOSIS — Z1231 Encounter for screening mammogram for malignant neoplasm of breast: Secondary | ICD-10-CM

## 2019-11-07 ENCOUNTER — Other Ambulatory Visit: Payer: Self-pay

## 2019-11-07 ENCOUNTER — Ambulatory Visit: Payer: Self-pay | Attending: Nurse Practitioner | Admitting: Nurse Practitioner

## 2019-11-07 ENCOUNTER — Encounter: Payer: Self-pay | Admitting: Nurse Practitioner

## 2019-11-07 DIAGNOSIS — E1165 Type 2 diabetes mellitus with hyperglycemia: Secondary | ICD-10-CM

## 2019-11-07 DIAGNOSIS — E1142 Type 2 diabetes mellitus with diabetic polyneuropathy: Secondary | ICD-10-CM

## 2019-11-07 DIAGNOSIS — I10 Essential (primary) hypertension: Secondary | ICD-10-CM

## 2019-11-07 DIAGNOSIS — E785 Hyperlipidemia, unspecified: Secondary | ICD-10-CM

## 2019-11-07 NOTE — Progress Notes (Signed)
Assessment & Plan:  Jenny [name] was seen today for follow-up.  Diagnoses and all orders for this visit:  Uncontrolled type 2 diabetes mellitus with hyperglycemia ([place]) Diabetes is poorly controlled. Advised patient to keep a fasting blood sugar log fast, 2 hours post lunch and bedtime which will be reviewed at the next office visit.  Continue blood sugar control as discussed in office today, low carbohydrate diet, and regular physical exercise as tolerated, 150 minutes per week (30 min each day, 5 days per week, or 50 min 3 days per week). Keep blood sugar logs with fasting goal of 90-130 mg/dl, post prandial (after you eat) less than 180.  For Hypoglycemia: BS <60 and Hyperglycemia BS >400; contact the clinic ASAP. Annual eye exams and foot exams are recommended.   Diabetic peripheral neuropathy associated with type 2 diabetes mellitus (HCC) -     gabapentin (NEURONTIN) 300 MG capsule; Take 1 capsule (300 mg total) by mouth at bedtime.  Essential hypertension Continue all antihypertensives as prescribed.  Remember to bring in your blood pressure log with you for your follow up appointment.  DASH/Mediterranean Diets are healthier choices for HTN.    Dyslipidemia, goal LDL below 70 INSTRUCTIONS: Work on a low fat, heart healthy diet and participate in regular aerobic exercise program by working out at least 150 minutes per week; 5 days a week-30 minutes per day. Avoid red meat/beef/steak,  fried foods. junk foods, sodas, sugary drinks, unhealthy snacking, alcohol and smoking.  Drink at least 80 oz of water per day and monitor your carbohydrate intake daily.     Patient has been counseled on age-appropriate routine health concerns for screening and prevention. These are reviewed and up-to-date. Referrals have been placed accordingly. Immunizations are up-to-date or declined.    Subjective:   Chief Complaint  Patient presents with  . Follow-up    Pt. is here for a follow up.     HPI Jenny [name] 54 y.o. female presents for telehealth visit today.   has a past medical history of Diabetes mellitus without complication ([place]), Hypertension, Metastatic disease ([place]), and Papillary thyroid carcinoma ([place]) (2008). Spanish Interpreter ID #[id number]    DM TYPE 2 Not at goal of <7.0 She is monitoring her blood glucose levels once a day. Average readings 140-200s. She denies any hypoglycemic symptoms. She has been prescribed gabapentin for hyperglycemic symptoms of neuropathy. She is overdue for eye exam and has been advised to apply for financial assistance and schedule to see our financial counselor. She is currently taking metformin 1000 mg BID.  Lab Results  Component Value Date   HGBA1C 7.2 (A) 08/07/2019   Dyslipidemia LDL not at goal of <70. She denies any statin intolerance however she has not been taking Atorvastatin as she was unclear as to the indications for taking a statin. I explained statins and the risk of heart attack or stroke with elevated  Lab Results  Component Value Date   LDLCALC 168 (H) 08/07/2019    Essential Hypertension Not well controlled. She endorses medication compliance taking amlodipine 5 mg daily and losartan 100 mg daily. She does not monitor her blood pressure at home and is not diet adherent in regard to DASH. WIll increase amlodipine to 10 mg at next office visit if blood pressure elevated.  BP Readings from Last 3 Encounters:  08/07/19 (!) 160/97  05/29/16 110/77  12/09/15 168/95   Review of Systems  Constitutional: Negative for fever, malaise/fatigue and weight loss.  HENT: Negative.  Negative  for nosebleeds.   Eyes: Negative.  Negative for blurred vision, double vision and photophobia.  Respiratory: Negative.  Negative for cough and shortness of breath.   Cardiovascular: Negative.  Negative for chest pain, palpitations and leg swelling.  Gastrointestinal: Negative.  Negative for heartburn, nausea and vomiting.   Musculoskeletal: Negative.  Negative for myalgias.  Neurological: Positive for sensory change. Negative for dizziness, focal weakness, seizures and headaches.  Psychiatric/Behavioral: Negative.  Negative for suicidal ideas.    Past Medical History:  Diagnosis Date  . Diabetes mellitus without complication ([place])   . Hypertension   . Metastatic disease ([place])   . Papillary thyroid carcinoma ([place]) 2008    Past Surgical History:  Procedure Laterality Date  . CESAREAN SECTION    . LEFT HEART CATHETERIZATION WITH CORONARY ANGIOGRAM N/A 10/23/2014   Procedure: LEFT HEART CATHETERIZATION WITH CORONARY ANGIOGRAM;  Surgeon: Larey Dresser, MD;  Location: [facility] CATH LAB;  Service: Cardiovascular;  Laterality: N/A;  . THYROIDECTOMY  03/2011    Family History  Problem Relation Age of Onset  . Diabetes Mother   . Hypertension Mother   . Diabetes Sister   . Cancer Sister     Social History Reviewed with no changes to be made today.   Outpatient Medications Prior to Visit  Medication Sig Dispense Refill  . Blood Glucose Monitoring Suppl (TRUE METRIX METER) w/Device KIT 1 each by Does not apply route as needed. 1 kit 0  . glucose blood (TRUE METRIX BLOOD GLUCOSE TEST) test strip 1 each by Other route 3 (three) times daily. 100 each 11  . TRUEplus Lancets 28G MISC 1 each by Does not apply route 3 (three) times daily. 100 each 11  . amLODipine (NORVASC) 5 MG tablet Take 1 tablet (5 mg total) by mouth daily. 90 tablet 1  . atorvastatin (LIPITOR) 20 MG tablet Take 1 tablet (20 mg total) by mouth daily. (Patient not taking: Reported on 11/07/2019) 90 tablet 3  . levothyroxine (SYNTHROID) 300 MCG tablet Take 1 tablet (300 mcg total) by mouth daily before breakfast. 30 tablet 1  . losartan (COZAAR) 100 MG tablet Take 1 tablet (100 mg total) by mouth daily. 90 tablet 1  . metFORMIN (GLUCOPHAGE) 1000 MG tablet Take 1 tablet (1,000 mg total) by mouth 2 (two) times daily with a meal. 180 tablet 1  .  gabapentin (NEURONTIN) 300 MG capsule Take 1 capsule (300 mg total) by mouth at bedtime. 90 capsule 1   No facility-administered medications prior to visit.    No Known Allergies     Objective:    There were no vitals taken for this visit. Wt Readings from Last 3 Encounters:  08/07/19 147 lb 9.6 oz (67 kg)  05/29/16 115 lb (52.2 kg)  09/16/15 137 lb 6.4 oz (62.3 kg)       Patient has been counseled extensively about nutrition and exercise as well as the importance of adherence with medications and regular follow-up. The patient was given clear instructions to go to ER or return to medical center if symptoms don't improve, worsen or new problems develop. The patient verbalized understanding.   Follow-up: Return in about 3 months (around 02/04/2020) for HTN/HPL/DM.   Gildardo Pounds, FNP-BC [facility] and [facility], [place]

## 2019-11-10 ENCOUNTER — Other Ambulatory Visit: Payer: Self-pay | Admitting: Nurse Practitioner

## 2019-11-10 ENCOUNTER — Encounter: Payer: Self-pay | Admitting: Nurse Practitioner

## 2019-11-10 DIAGNOSIS — E1142 Type 2 diabetes mellitus with diabetic polyneuropathy: Secondary | ICD-10-CM | POA: Insufficient documentation

## 2019-11-10 DIAGNOSIS — E039 Hypothyroidism, unspecified: Secondary | ICD-10-CM

## 2019-11-10 DIAGNOSIS — Z1211 Encounter for screening for malignant neoplasm of colon: Secondary | ICD-10-CM

## 2019-11-10 DIAGNOSIS — E785 Hyperlipidemia, unspecified: Secondary | ICD-10-CM

## 2019-11-10 DIAGNOSIS — E1165 Type 2 diabetes mellitus with hyperglycemia: Secondary | ICD-10-CM

## 2019-11-10 DIAGNOSIS — R7989 Other specified abnormal findings of blood chemistry: Secondary | ICD-10-CM

## 2019-11-10 MED ORDER — GABAPENTIN 300 MG PO CAPS: 300.0000 mg | ORAL_CAPSULE | Freq: Every day | ORAL | 1 refills | Status: AC

## 2019-11-12 MED FILL — GABAPENTIN 300 MG CAPSULE: 300 | 90 days supply | Qty: 90 | Fill #0

## 2019-11-13 ENCOUNTER — Other Ambulatory Visit: Payer: Self-pay

## 2019-11-13 ENCOUNTER — Encounter (HOSPITAL_COMMUNITY): Payer: Self-pay

## 2019-11-13 ENCOUNTER — Ambulatory Visit (HOSPITAL_COMMUNITY)
Admission: RE | Admit: 2019-11-13 | Discharge: 2019-11-13 | Disposition: A | Discharge status: Home / Self Care | Payer: Self-pay | Source: Ambulatory Visit | Attending: Obstetrics and Gynecology | Admitting: Obstetrics and Gynecology

## 2019-11-13 DIAGNOSIS — Z01419 Encounter for gynecological examination (general) (routine) without abnormal findings: Secondary | ICD-10-CM | POA: Insufficient documentation

## 2019-11-13 NOTE — Patient Instructions (Signed)
Explained breast self awareness with Gorden Harms. Let patient know [facility] will cover Pap smears and HPV typing every 5 years unless has a history of abnormal Pap smears. Referred patient to the [facility] for a screening mammogram. Appointment scheduled for Thursday, November 15, 2019 at [facility]. Patient aware of appointment and will be there. Let patient know will follow up with her within the next couple weeks with results of her Pap smear by letter or phone. Informed patient that the [facility] will follow-up with her within the next couple of weeks with results of her mammogram by letter or phone. Gorden Harms verbalized understanding.  Ulas Zuercher, Arvil Chaco, RN 1:17 PM

## 2019-11-13 NOTE — Addendum Note (Signed)
Encounter addended by: Loletta Parish, RN on: 11/13/2019 4:16 PM  Actions taken: Clinical Note Signed

## 2019-11-13 NOTE — Progress Notes (Addendum)
No complaints today.   Pap Smear: Pap smear completed today. Last Pap smear was in 2016 and normal per patient. Per patient has no history of an abnormal Pap smear. No Pap smear results are in Epic.  Physical exam: Breasts Breasts symmetrical. No skin abnormalities bilateral breasts. No nipple retraction bilateral breasts. No nipple discharge bilateral breasts. No lymphadenopathy. No lumps palpated bilateral breasts. No complaints of pain or tenderness on exam. Referred patient to the [facility] for a screening mammogram. Appointment scheduled for Thursday, November 15, 2019 at [facility].        Pelvic/Bimanual   Ext Genitalia  No lesions, no swelling and no discharge observed on external genitalia.         Vagina Vagina pink and normal texture. No lesions or discharge observed in vagina.          Cervix Cervix is present. Cervix pink and of normal texture. No discharge observed.     Uterus Uterus is present and palpable. Uterus in normal position and normal size.        Adnexae Bilateral ovaries present and palpable. No tenderness on palpation.         Rectovaginal No rectal exam completed today since patient had no rectal complaints. No skin abnormalities observed on exam.    Smoking History: Patient has never smoked.  Patient Navigation: Patient education provided. Access to services provided for patient through [facility] program. Spanish interpreter provided.   Colorectal Cancer Screening: Per patient has never had a colonoscopy completed. No complaints today.   Breast and Cervical Cancer Risk Assessment: Patient has a family history of her sister having breast cancer. Patient has no known genetic mutations or history of radiation treatment to the chest before age 65. Patient has no history of cervical dysplasia, immunocompromised, or DES exposure in-utero.  Risk Assessment    Risk Scores      11/13/2019   Last edited by: Demetrius Revel, LPN   5-year risk: 1.5  %   Lifetime risk: 10.1 %         Used Spanish interpreter Rudene Anda from [place].

## 2019-11-14 ENCOUNTER — Other Ambulatory Visit: Payer: Self-pay

## 2019-11-14 ENCOUNTER — Ambulatory Visit: Payer: Self-pay | Attending: Nurse Practitioner

## 2019-11-14 DIAGNOSIS — E1165 Type 2 diabetes mellitus with hyperglycemia: Secondary | ICD-10-CM

## 2019-11-14 DIAGNOSIS — E039 Hypothyroidism, unspecified: Secondary | ICD-10-CM

## 2019-11-14 DIAGNOSIS — R7989 Other specified abnormal findings of blood chemistry: Secondary | ICD-10-CM

## 2019-11-14 DIAGNOSIS — E785 Hyperlipidemia, unspecified: Secondary | ICD-10-CM

## 2019-11-14 LAB — CYTOLOGY - PAP
Comment: NEGATIVE
Diagnosis: NEGATIVE
High risk HPV: NEGATIVE

## 2019-11-15 ENCOUNTER — Ambulatory Visit
Admission: RE | Admit: 2019-11-15 | Discharge: 2019-11-15 | Disposition: A | Discharge status: Home / Self Care | Payer: No Typology Code available for payment source | Source: Ambulatory Visit | Attending: Obstetrics and Gynecology | Admitting: Obstetrics and Gynecology

## 2019-11-15 DIAGNOSIS — Z1231 Encounter for screening mammogram for malignant neoplasm of breast: Secondary | ICD-10-CM

## 2019-11-15 LAB — LIPID PANEL
Chol/HDL Ratio: 5.5 ratio — ABNORMAL HIGH (ref 0.0–4.4)
Cholesterol, Total: 275 mg/dL — ABNORMAL HIGH (ref 100–199)
HDL: 50 mg/dL (ref >39)
LDL Chol Calc (NIH): 201 mg/dL — ABNORMAL HIGH (ref 0–99)
Triglycerides: 130 mg/dL (ref 0–149)
VLDL Cholesterol Cal: 24 mg/dL (ref 5–40)

## 2019-11-15 LAB — CMP14+EGFR
ALT: 12 IU/L (ref 0–32)
AST: 17 IU/L (ref 0–40)
Albumin/Globulin Ratio: 1.5 (ref 1.2–2.2)
Albumin: 4.3 g/dL (ref 3.8–4.9)
Alkaline Phosphatase: 135 IU/L — ABNORMAL HIGH (ref 39–117)
BUN/Creatinine Ratio: 17 (ref 9–23)
BUN: 11 mg/dL (ref 6–24)
Bilirubin Total: 0.5 mg/dL (ref 0.0–1.2)
CO2: 25 mmol/L (ref 20–29)
Calcium: 9.3 mg/dL (ref 8.7–10.2)
Chloride: 97 mmol/L (ref 96–106)
Creatinine, Ser: 0.64 mg/dL (ref 0.57–1.00)
GFR calc Af Amer: 116 mL/min/{1.73_m2} (ref >59)
GFR calc non Af Amer: 101 mL/min/{1.73_m2} (ref >59)
Globulin, Total: 2.8 g/dL (ref 1.5–4.5)
Glucose: 317 mg/dL — ABNORMAL HIGH (ref 65–99)
Potassium: 4.1 mmol/L (ref 3.5–5.2)
Sodium: 135 mmol/L (ref 134–144)
Total Protein: 7.1 g/dL (ref 6.0–8.5)

## 2019-11-15 LAB — HEMOGLOBIN A1C
Est. average glucose Bld gHb Est-mCnc: 312 mg/dL
Hgb A1c MFr Bld: 12.5 % — ABNORMAL HIGH (ref 4.8–5.6)

## 2019-11-15 LAB — HEPATITIS C ANTIBODY: Hep C Virus Ab: <0.1 s/co ratio (ref 0.0–0.9)

## 2019-11-15 LAB — TSH: TSH: 17.7 u[IU]/mL — ABNORMAL HIGH (ref 0.450–4.500)

## 2019-11-18 ENCOUNTER — Other Ambulatory Visit: Payer: Self-pay | Admitting: Nurse Practitioner

## 2019-11-18 DIAGNOSIS — I10 Essential (primary) hypertension: Secondary | ICD-10-CM

## 2019-11-18 DIAGNOSIS — Z1321 Encounter for screening for nutritional disorder: Secondary | ICD-10-CM

## 2019-11-18 DIAGNOSIS — E1165 Type 2 diabetes mellitus with hyperglycemia: Secondary | ICD-10-CM

## 2019-11-18 MED ORDER — LEVOTHYROXINE SODIUM 300 MCG PO TABS: 300.0000 ug | ORAL_TABLET | Freq: Every day | ORAL | 1 refills | Status: AC

## 2019-11-18 MED ORDER — BD PEN NEEDLE MINI U/F 31G X 5 MM MISC: 6 refills | Status: AC

## 2019-11-18 MED ORDER — VICTOZA 18 MG/3ML ~~LOC~~ SOPN: 1.8000 mg | PEN_INJECTOR | Freq: Every day | SUBCUTANEOUS | 6 refills | Status: DC

## 2019-11-18 MED ORDER — TRUEPLUS LANCETS 28G MISC: 1.0000 | Freq: Three times a day (TID) | 11 refills | Status: AC

## 2019-11-18 MED ORDER — LOSARTAN POTASSIUM 100 MG PO TABS: 100.0000 mg | ORAL_TABLET | Freq: Every day | ORAL | 1 refills | Status: AC

## 2019-11-18 MED ORDER — AMLODIPINE BESYLATE 5 MG PO TABS: 5.0000 mg | ORAL_TABLET | Freq: Every day | ORAL | 1 refills | Status: AC

## 2019-11-18 MED ORDER — ATORVASTATIN CALCIUM 20 MG PO TABS: 20.0000 mg | ORAL_TABLET | Freq: Every day | ORAL | 3 refills | Status: AC

## 2019-11-18 MED ORDER — METFORMIN HCL 1000 MG PO TABS: 1000.0000 mg | ORAL_TABLET | Freq: Two times a day (BID) | ORAL | 1 refills | Status: AC

## 2019-11-19 ENCOUNTER — Other Ambulatory Visit: Payer: Self-pay | Admitting: Nurse Practitioner

## 2019-11-19 ENCOUNTER — Telehealth: Payer: Self-pay

## 2019-11-19 MED ORDER — TRULICITY 1.5 MG/0.5ML ~~LOC~~ SOAJ: 1.5000 mg | SUBCUTANEOUS | 3 refills | Status: AC

## 2019-11-19 MED FILL — TRULICITY 1.5 MG/0.5 ML PEN: 1.5 | 28 days supply | Qty: 2 | Fill #0

## 2019-11-19 MED FILL — metFORMIN HCL 1000 MG TABS: 1000 | 30 days supply | Qty: 60 | Fill #0

## 2019-11-19 MED FILL — LEVOTHYROXINE SODIUM 300 MC: 300 | 30 days supply | Qty: 30 | Fill #0

## 2019-11-19 MED FILL — ATORVASTATIN CALCIUM 20 MG: 20 | 30 days supply | Qty: 30 | Fill #0

## 2019-11-19 MED FILL — LOSARTAN POTASSIUM 100 MG T: 100 | 30 days supply | Qty: 30 | Fill #0

## 2019-11-19 MED FILL — AMLODIPINE BESYLATE 5 MG TA: 5 | 30 days supply | Qty: 30 | Fill #0

## 2019-11-19 MED FILL — TRUEplus LANCETS 28G MISC: 33 days supply | Qty: 100 | Fill #0

## 2019-11-19 NOTE — Telephone Encounter (Signed)
If appropriate, can we change the pt to [facility] for PASS.  [facility] requires a Occupational psychologist for PASS enrollment where [facility] does not.

## 2019-11-19 NOTE — Telephone Encounter (Signed)
Sent!

## 2019-11-20 ENCOUNTER — Telehealth: Payer: Self-pay

## 2019-11-20 NOTE — Telephone Encounter (Signed)
-----   Message from Loletta Parish, RN sent at 11/19/2019 12:57 PM EST ----- Regarding: Pap smear result Hello [name],  Will call the patient with Pap smear result? Pap smear normal with negative HPV. Next Pap smear 5 years.   Thanks, [employer]

## 2019-11-20 NOTE — Telephone Encounter (Signed)
Via Doroteo Bradford, [place] Interpreter, patient was informed negative Pap smear and HPV, next pap smear due in 5 years.

## 2020-01-09 ENCOUNTER — Ambulatory Visit: Payer: Self-pay | Attending: Nurse Practitioner | Admitting: Nurse Practitioner

## 2020-01-09 ENCOUNTER — Other Ambulatory Visit: Payer: Self-pay

## 2020-01-09 ENCOUNTER — Encounter: Payer: Self-pay | Admitting: Nurse Practitioner

## 2020-01-09 DIAGNOSIS — C786 Secondary malignant neoplasm of retroperitoneum and peritoneum: Secondary | ICD-10-CM

## 2020-01-09 DIAGNOSIS — R918 Other nonspecific abnormal finding of lung field: Secondary | ICD-10-CM

## 2020-01-09 DIAGNOSIS — C73 Malignant neoplasm of thyroid gland: Secondary | ICD-10-CM

## 2020-01-09 DIAGNOSIS — F321 Major depressive disorder, single episode, moderate: Secondary | ICD-10-CM

## 2020-01-09 DIAGNOSIS — R519 Headache, unspecified: Secondary | ICD-10-CM

## 2020-01-09 NOTE — Progress Notes (Signed)
Virtual Visit via Telephone Note Due to national recommendations of social distancing due to Alicia 19, telehealth visit is felt to be most appropriate for this patient at this time.  I discussed the limitations, risks, security and privacy concerns of performing an evaluation and management service by telephone and the availability of in person appointments. I also discussed with the patient that there may be a patient responsible charge related to this service. The patient expressed understanding and agreed to proceed.    I connected with Gorden Harms on 01/12/20  at   3:30 PM EDT  EDT by telephone and verified that I am speaking with the correct person using two identifiers.   Consent I discussed the limitations, risks, security and privacy concerns of performing an evaluation and management service by telephone and the availability of in person appointments. I also discussed with the patient that there may be a patient responsible charge related to this service. The patient expressed understanding and agreed to proceed.   Location of Patient: Private Residence    Location of Provider: [place] [facility] [place] participating in Telemedicine visit: Geryl Rankins FNP-BC [place] Interpreter ID# Hassell Done [id number]   History of Present Illness: Telemedicine visit for: F/U She is not taking any of her medications. States she does not feel well. She moved back to [facility] Last September. She stopped taking her chemotherapy medications while she was in [place]. States she did not want to take them anymore. This was not advised by her oncologist. Last A1c 2 months ago was 12.5. Endorses headache, backache, bones hurting and hemoptysis. Hemoptysis started 2 weeks ago.  Missed her office appt on 11-9 and with the pharmacist on 11-17.  She has not been monitoring her blood sugars. CT was ordered 08-08-2019 however was never completed. Will refer to  Hematology and reschedule CT.  States she does not have a valid SSN so will not be able to apply for Medicaid.   I have instructed her to pick up her medications from the pharmacy as soon as possible.   Past Medical History:  Diagnosis Date  . Diabetes mellitus without complication ([place])   . Hypertension   . Metastatic disease ([place])   . Papillary thyroid carcinoma ([place]) 2008    Past Surgical History:  Procedure Laterality Date  . CESAREAN SECTION    . LEFT HEART CATHETERIZATION WITH CORONARY ANGIOGRAM N/A 10/23/2014   Procedure: LEFT HEART CATHETERIZATION WITH CORONARY ANGIOGRAM;  Surgeon: Larey Dresser, MD;  Location: [facility] CATH LAB;  Service: Cardiovascular;  Laterality: N/A;  . THYROIDECTOMY  03/2011    Family History  Problem Relation Age of Onset  . Diabetes Mother   . Hypertension Mother   . Diabetes Sister   . Cancer Sister   . Hypertension Sister   . Breast cancer Sister 51    Social History   Socioeconomic History  . Marital status: Single    Spouse name: Not on file  . Number of children: 3  . Years of education: Not on file  . Highest education level: 6th grade  Occupational History  . Not on file  Tobacco Use  . Smoking status: Never Smoker  . Smokeless tobacco: Never Used  Substance and Sexual Activity  . Alcohol use: No  . Drug use: Never  . Sexual activity: Not Currently  Other Topics Concern  . Not on file  Social History Narrative   Has 3 children  92 yo son with cystic fibrosis   106 yo daughter   68 yo child   Also care for 28 yo grandson    Social Determinants of Radio broadcast assistant Strain:   . Difficulty of Paying Living Expenses:   Food Insecurity:   . Worried About Charity fundraiser in the Last Year:   . Arboriculturist in the Last Year:   Transportation Needs: Unmet Transportation Needs  . Lack of Transportation (Medical): Yes  . Lack of Transportation (Non-Medical): Yes  Physical Activity:   . Days of Exercise per Week:    . Minutes of Exercise per Session:   Stress:   . Feeling of Stress :   Social Connections:   . Frequency of Communication with Friends and Family:   . Frequency of Social Gatherings with Friends and Family:   . Attends Religious Services:   . Active Member of Clubs or Organizations:   . Attends Archivist Meetings:   Marland Kitchen Marital Status:      Observations/Objective: Awake, alert and oriented x 3   Review of Systems  Constitutional: Positive for malaise/fatigue. Negative for fever and weight loss.  HENT: Negative.  Negative for nosebleeds.   Eyes: Negative.  Negative for blurred vision, double vision and photophobia.  Respiratory: Negative.  Negative for cough and shortness of breath.        SEE HPI  Cardiovascular: Negative.  Negative for chest pain, palpitations and leg swelling.  Gastrointestinal: Negative.  Negative for heartburn, nausea and vomiting.  Musculoskeletal: Positive for back pain and joint pain. Negative for myalgias.  Neurological: Positive for headaches. Negative for dizziness, focal weakness and seizures.  Psychiatric/Behavioral: Positive for depression. Negative for suicidal ideas.    Assessment and Plan: Camillia was seen today for headache.  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     Follow Up Instructions Return in about 4 weeks (around 02/06/2020).     I discussed the assessment and treatment plan with the patient. The patient was provided an opportunity to ask questions and all were answered. The patient  agreed with the plan and demonstrated an understanding of the instructions.   The patient was advised to call back or seek an in-person evaluation if the symptoms worsen or if the condition fails to improve as anticipated.  I provided 17 minutes of non-face-to-face time during this encounter including median intraservice time, reviewing previous notes, labs, imaging, medications and explaining diagnosis and management.  Gildardo Pounds, FNP-BC

## 2020-01-12 ENCOUNTER — Encounter: Payer: Self-pay | Admitting: Nurse Practitioner

## 2020-01-12 ENCOUNTER — Other Ambulatory Visit: Payer: Self-pay | Admitting: Nurse Practitioner

## 2020-01-12 MED ORDER — ESCITALOPRAM OXALATE 10 MG PO TABS: 10.0000 mg | ORAL_TABLET | Freq: Every day | ORAL | 1 refills | Status: DC

## 2020-01-14 ENCOUNTER — Telehealth: Payer: Self-pay | Admitting: Licensed Clinical Social Worker

## 2020-01-14 MED FILL — ESCITALOPRAM 10 MG TABLET: 10 | 30 days supply | Qty: 30 | Fill #0

## 2020-01-14 NOTE — Telephone Encounter (Signed)
Call placed to patient utilizing [facility] [phone]) Voicemail was not set-up; therefore, LCSW was unable to leave a message requesting a return call.

## 2020-01-15 ENCOUNTER — Ambulatory Visit (HOSPITAL_COMMUNITY)
Admission: RE | Admit: 2020-01-15 | Discharge: 2020-01-15 | Disposition: A | Discharge status: Home / Self Care | Payer: Self-pay | Source: Ambulatory Visit | Attending: Nurse Practitioner | Admitting: Nurse Practitioner

## 2020-01-15 ENCOUNTER — Telehealth: Payer: Self-pay

## 2020-01-15 ENCOUNTER — Emergency Department (HOSPITAL_COMMUNITY)
Admission: EM | Admit: 2020-01-15 | Discharge: 2020-01-15 | Disposition: A | Discharge status: Home / Self Care | Payer: No Typology Code available for payment source | Attending: Emergency Medicine | Admitting: Emergency Medicine

## 2020-01-15 ENCOUNTER — Other Ambulatory Visit: Payer: Self-pay

## 2020-01-15 ENCOUNTER — Encounter (HOSPITAL_COMMUNITY): Payer: Self-pay

## 2020-01-15 DIAGNOSIS — C786 Secondary malignant neoplasm of retroperitoneum and peritoneum: Secondary | ICD-10-CM | POA: Insufficient documentation

## 2020-01-15 DIAGNOSIS — R0602 Shortness of breath: Secondary | ICD-10-CM | POA: Insufficient documentation

## 2020-01-15 DIAGNOSIS — I10 Essential (primary) hypertension: Secondary | ICD-10-CM | POA: Insufficient documentation

## 2020-01-15 DIAGNOSIS — E119 Type 2 diabetes mellitus without complications: Secondary | ICD-10-CM | POA: Insufficient documentation

## 2020-01-15 DIAGNOSIS — Z85850 Personal history of malignant neoplasm of thyroid: Secondary | ICD-10-CM | POA: Insufficient documentation

## 2020-01-15 DIAGNOSIS — I2694 Multiple subsegmental pulmonary emboli without acute cor pulmonale: Secondary | ICD-10-CM | POA: Insufficient documentation

## 2020-01-15 DIAGNOSIS — C7800 Secondary malignant neoplasm of unspecified lung: Secondary | ICD-10-CM | POA: Insufficient documentation

## 2020-01-15 DIAGNOSIS — Z79899 Other long term (current) drug therapy: Secondary | ICD-10-CM | POA: Insufficient documentation

## 2020-01-15 DIAGNOSIS — Z7984 Long term (current) use of oral hypoglycemic drugs: Secondary | ICD-10-CM | POA: Insufficient documentation

## 2020-01-15 LAB — COMPREHENSIVE METABOLIC PANEL
ALT: 15 U/L (ref 0–44)
AST: 19 U/L (ref 15–41)
Albumin: 3.7 g/dL (ref 3.5–5.0)
Alkaline Phosphatase: 114 U/L (ref 38–126)
Anion gap: 12 (ref 5–15)
BUN: 9 mg/dL (ref 6–20)
CO2: 30 mmol/L (ref 22–32)
Calcium: 9.1 mg/dL (ref 8.9–10.3)
Chloride: 90 mmol/L — ABNORMAL LOW (ref 98–111)
Creatinine, Ser: 0.81 mg/dL (ref 0.44–1.00)
GFR calc Af Amer: >60 mL/min (ref >60)
GFR calc non Af Amer: >60 mL/min (ref >60)
Glucose, Bld: 391 mg/dL — ABNORMAL HIGH (ref 70–99)
Potassium: 3.9 mmol/L (ref 3.5–5.1)
Sodium: 132 mmol/L — ABNORMAL LOW (ref 135–145)
Total Bilirubin: 1.1 mg/dL (ref 0.3–1.2)
Total Protein: 7.5 g/dL (ref 6.5–8.1)

## 2020-01-15 LAB — CBC WITH DIFFERENTIAL/PLATELET
Abs Immature Granulocytes: 0.02 10*3/uL (ref 0.00–0.07)
Basophils Absolute: 0 10*3/uL (ref 0.0–0.1)
Basophils Relative: 0 %
Eosinophils Absolute: 0.1 10*3/uL (ref 0.0–0.5)
Eosinophils Relative: 1 %
HCT: 39.9 % (ref 36.0–46.0)
Hemoglobin: 13.5 g/dL (ref 12.0–15.0)
Immature Granulocytes: 0 %
Lymphocytes Relative: 9 %
Lymphs Abs: 0.8 10*3/uL (ref 0.7–4.0)
MCH: 30.1 pg (ref 26.0–34.0)
MCHC: 33.8 g/dL (ref 30.0–36.0)
MCV: 89.1 fL (ref 80.0–100.0)
Monocytes Absolute: 0.3 10*3/uL (ref 0.1–1.0)
Monocytes Relative: 4 %
Neutro Abs: 7.3 10*3/uL (ref 1.7–7.7)
Neutrophils Relative %: 86 %
Platelets: 219 10*3/uL (ref 150–400)
RBC: 4.48 MIL/uL (ref 3.87–5.11)
RDW: 12.9 % (ref 11.5–15.5)
WBC: 8.5 10*3/uL (ref 4.0–10.5)
nRBC: 0 % (ref 0.0–0.2)

## 2020-01-15 LAB — PROTIME-INR
INR: 1 (ref 0.8–1.2)
Prothrombin Time: 13.1 seconds (ref 11.4–15.2)

## 2020-01-15 LAB — POCT I-STAT CREATININE: Creatinine, Ser: 0.6 mg/dL (ref 0.44–1.00)

## 2020-01-15 LAB — APTT: aPTT: 35 seconds (ref 24–36)

## 2020-01-15 MED ORDER — IOHEXOL 350 MG/ML SOLN
80.0000 mL | Freq: Once | INTRAVENOUS | Status: AC | PRN
  Administered 2020-01-15: 57 mL via INTRAVENOUS

## 2020-01-15 MED ORDER — RIVAROXABAN (XARELTO) VTE STARTER PACK (15 & 20 MG): ORAL_TABLET | ORAL | 0 refills | Status: AC

## 2020-01-15 MED ORDER — RIVAROXABAN (XARELTO) EDUCATION KIT FOR DVT/PE PATIENTS
PACK | Freq: Once | Status: AC
  Filled 2020-01-15: qty 1

## 2020-01-15 MED ORDER — RIVAROXABAN 15 MG PO TABS
15.0000 mg | ORAL_TABLET | Freq: Once | ORAL | Status: AC
  Administered 2020-01-15: 15 mg via ORAL
  Filled 2020-01-15: qty 1

## 2020-01-15 NOTE — ED Triage Notes (Signed)
Interpretor [id number]  Pt sent for CT scan today by PCP, instructed to come to ED after having a CT scan for a blood clot.  Pt reports coughing up droplets of blood, chest pain and face. Pt reports Ca to lungs and throat in 2008.

## 2020-01-15 NOTE — ED Provider Notes (Signed)
[facility] EMERGENCY DEPARTMENT Provider Note   CSN: [id number] Arrival date & time: 01/15/20  1322     History Chief Complaint  Patient presents with  . sent by pcp    Jenny [name] is a 54 y.o. female.  HPI 54 year old female presents with coughing up blood, shortness of breath, chest discomfort.  She was told to come to the ER by her PCP.  Had a CT scan today that showed 2 segmental nonocclusive pulmonary emboli.  The patient states she has been having some blood in sputum for the last 2 weeks.  She describes it as a small amount inside of sputum.  She also has some shortness of breath and chest discomfort but is not that bad.  The chest pain is mostly right-sided when she coughs.  History of metastatic thyroid cancer that she stopped taking chemotherapy for in November 2019.  She endorses that the medicine made her feel poorly and so she does not want to go back onto chemotherapy.  She is not interested in seeing an oncologist due to not wanting to go back on chemotherapy treatment.  A Spanish interpreter line was used to help interpret.   Past Medical History:  Diagnosis Date  . Diabetes mellitus without complication ([place])   . Hypertension   . Metastatic disease ([place])   . Papillary thyroid carcinoma [facility]) 2008    Patient Active Problem List   Diagnosis Date Noted  . Well woman exam with routine gynecological exam 11/13/2019  . Diabetic peripheral neuropathy associated with type 2 diabetes mellitus ([place]) 11/10/2019  . Pulmonary nodules 09/02/2015  . Breast pain 09/02/2015  . Anterior neck pain 09/02/2015  . Papillary thyroid carcinoma ([place]) 09/02/2015  . Chest pain 10/22/2014  . Thyroid cancer ([place]) 10/22/2014  . Metastatic disease ([place]) 10/22/2014  . Essential hypertension 10/22/2014  . Elevated troponin   . Uncontrolled type 2 diabetes mellitus with hyperglycemia ([place]) 05/03/2014  . Hypothyroidism 05/03/2014  . History of thyroid cancer  05/03/2014  . Back muscle spasm 05/03/2014  . Chronic low back pain 05/03/2014  . Blurry vision 05/03/2014    Past Surgical History:  Procedure Laterality Date  . CESAREAN SECTION    . LEFT HEART CATHETERIZATION WITH CORONARY ANGIOGRAM N/A 10/23/2014   Procedure: LEFT HEART CATHETERIZATION WITH CORONARY ANGIOGRAM;  Surgeon: Larey Dresser, MD;  Location: [facility] CATH LAB;  Service: Cardiovascular;  Laterality: N/A;  . THYROIDECTOMY  03/2011     OB History    Gravida  3   Para      Term      Preterm      AB      Living  3     SAB      TAB      Ectopic      Multiple      Live Births              Family History  Problem Relation Age of Onset  . Diabetes Mother   . Hypertension Mother   . Diabetes Sister   . Cancer Sister   . Hypertension Sister   . Breast cancer Sister 74    Social History   Tobacco Use  . Smoking status: Never Smoker  . Smokeless tobacco: Never Used  Substance Use Topics  . Alcohol use: No  . Drug use: Never    Home Medications Prior to Admission medications   Medication Sig Start Date End Date Taking? Authorizing Provider  Cholecalciferol (VITAMIN  D3) 10 MCG (400 UNIT) tablet Take 800-1,600 Units by mouth daily.  06/30/16  Yes [provider]  ibuprofen (ADVIL) 600 MG tablet Take 600 mg by mouth 2 (two) times daily as needed.  06/30/16  Yes [provider]  levothyroxine (SYNTHROID) 300 MCG tablet Take 1 tablet (300 mcg total) by mouth daily before breakfast. 11/18/19 01/15/20 Yes Gildardo Pounds, NP  losartan (COZAAR) 100 MG tablet Take 1 tablet (100 mg total) by mouth daily. 11/18/19 02/16/20 Yes Gildardo Pounds, NP  metFORMIN (GLUCOPHAGE) 1000 MG tablet Take 1 tablet (1,000 mg total) by mouth 2 (two) times daily with a meal. 11/18/19 02/16/20 Yes Gildardo Pounds, NP  amLODipine (NORVASC) 5 MG tablet Take 1 tablet (5 mg total) by mouth daily. Patient not taking: Reported on 01/09/2020 11/18/19 02/16/20  Gildardo Pounds, NP   atorvastatin (LIPITOR) 20 MG tablet Take 1 tablet (20 mg total) by mouth daily. Patient not taking: Reported on 01/09/2020 11/18/19   Gildardo Pounds, NP  Blood Glucose Monitoring Suppl (TRUE METRIX METER) w/Device KIT 1 each by Does not apply route as needed. Patient not taking: Reported on 01/09/2020 08/07/19   Gildardo Pounds, NP  escitalopram (LEXAPRO) 10 MG tablet Take 1 tablet (10 mg total) by mouth daily. Patient not taking: Reported on 01/15/2020 01/12/20   Gildardo Pounds, NP  gabapentin (NEURONTIN) 300 MG capsule Take 1 capsule (300 mg total) by mouth at bedtime. Patient not taking: Reported on 11/13/2019 11/10/19 02/08/20  Gildardo Pounds, NP  glucose blood (TRUE METRIX BLOOD GLUCOSE TEST) test strip 1 each by Other route 3 (three) times daily. Patient not taking: Reported on 01/09/2020 08/07/19   Gildardo Pounds, NP  Insulin Pen Needle (B-D UF III MINI PEN NEEDLES) 31G X 5 MM MISC Use as instructed. Inject into the skin once daily Patient not taking: Reported on 01/09/2020 11/18/19   Gildardo Pounds, NP  RIVAROXABAN Alveda Reasons) VTE STARTER PACK (15 & 20 MG TABLETS) Follow package directions: Take one 56m tablet by mouth twice a day. On day 22, switch to one 27mtablet once a day. Take with food. 01/15/20   GoSherwood GamblerMD  TRUEplus Lancets 28G MISC 1 each by Does not apply route 3 (three) times daily. Use as instructed. Check blood glucose level by fingerstick three times per day.  E11.65 Patient not taking: Reported on 01/09/2020 11/18/19   FlGildardo PoundsNP    Allergies    Patient has no known allergies.  Review of Systems   Review of Systems  Respiratory: Positive for cough and shortness of breath.   Cardiovascular: Positive for chest pain.  All other systems reviewed and are negative.   Physical Exam Updated Vital Signs BP (!) 183/97   Pulse 85   Temp 98.5 F (36.9 C) (Oral)   Resp (!) 24   SpO2 92%   Physical Exam Vitals and nursing note reviewed.  Constitutional:       General: She is not in acute distress.    Appearance: She is well-developed. She is not ill-appearing or diaphoretic.  HENT:     Head: Normocephalic and atraumatic.     Right Ear: External ear normal.     Left Ear: External ear normal.     Nose: Nose normal.  Eyes:     General:        Right eye: No discharge.        Left eye: No discharge.  Neck:  Comments: Mass to the anterior neck that is firm and just right of midline Cardiovascular:     Rate and Rhythm: Normal rate and regular rhythm.     Heart sounds: Normal heart sounds.  Pulmonary:     Effort: Pulmonary effort is normal.     Breath sounds: Normal breath sounds. No wheezing, rhonchi or rales.  Abdominal:     Palpations: Abdomen is soft.     Tenderness: There is no abdominal tenderness.  Skin:    General: Skin is warm and dry.  Neurological:     Mental Status: She is alert.  Psychiatric:        Mood and Affect: Mood is not anxious.     ED Results / Procedures / Treatments   Labs (all labs ordered are listed, but only abnormal results are displayed) Labs Reviewed  COMPREHENSIVE METABOLIC PANEL - Abnormal; Notable for the following components:      Result Value   Sodium 132 (*)    Chloride 90 (*)    Glucose, Bld 391 (*)    All other components within normal limits  CBC WITH DIFFERENTIAL/PLATELET  PROTIME-INR  APTT    EKG EKG Interpretation  Date/Time:  Tuesday January 15 2020 13:49:54 EDT Ventricular Rate:  92 PR Interval:  170 QRS Duration: 86 QT Interval:  374 QTC Calculation: 462 R Axis:   -32 Text Interpretation: Normal sinus rhythm Left axis deviation Nonspecific ST abnormality Abnormal ECG nonspecific ST changes similar to 2016 Confirmed by Sherwood Gambler [phone]) on 01/15/2020 3:15:43 PM   Radiology CT Angio Chest W/Cm &/Or Wo Cm  Result Date: 01/15/2020 CLINICAL DATA:  Right-sided pleuritic chest pain and back pain for the past 2 weeks. History of metastatic thyroid cancer. EXAM: CT ANGIOGRAPHY  CHEST WITH CONTRAST TECHNIQUE: Multidetector CT imaging of the chest was performed using the standard protocol during bolus administration of intravenous contrast. Multiplanar CT image reconstructions and MIPs were obtained to evaluate the vascular anatomy. CONTRAST:  1m OMNIPAQUE IOHEXOL 350 MG/ML SOLN COMPARISON:  Chest x-ray dated September 03, 2016. CTA chest dated October 21, 2014. FINDINGS: Cardiovascular: Satisfactory opacification of the pulmonary arteries to the segmental level. Small nonocclusive filling defects in the bilateral upper lobe segmental pulmonary arteries (series 6, image 48). No additional pulmonary emboli. Narrowing of the left anterior and apical segmental pulmonary arteries due to metastatic disease. Mildly enlarged main pulmonary artery measuring up to 3.3 cm in diameter. Mild cardiomegaly. No pericardial effusion. No thoracic aortic aneurysm. Mediastinum/Nodes: Multiple enlarged right hilar lymph nodes measuring up to 1.8 cm in short axis, new since the prior study. Right paratracheal lymph node measuring up to 9 mm in short axis. Left paratracheal lymph node measuring 1.9 cm in short axis. No enlarged axillary lymph nodes. Prior thyroidectomy. 2.5 x 2.3 cm soft tissue mass on the right anterior to the thyroidectomy bed. The trachea and esophagus demonstrate no significant findings. Lungs/Pleura: Innumerable pulmonary nodules and masses throughout both lungs, increased in size and number since 2016. Scattered perilymphatic nodularity, most prominent in the left upper lobe. Nodular thickening of the right major and minor fissures. Mild bronchiectasis in the anterior left upper lobe. No focal consolidation, pleural effusion, or pneumothorax. Upper Abdomen: No acute abnormality. Musculoskeletal: No chest wall abnormality. No acute or significant osseous findings. Review of the MIP images confirms the above findings. IMPRESSION: 1. Small nonocclusive bilateral upper lobe segmental pulmonary  emboli. 2. Progressive widespread pulmonary and nodal metastatic disease. 3. New right-sided 2.5 cm soft tissue mass  anterior to the thyroidectomy bed consistent with recurrent primary or metastatic disease. These results will be called to the ordering clinician or representative by the Radiologist Assistant, and communication documented in the PACS or [employer]. Electronically Signed   By: Titus Dubin M.D.   On: 01/15/2020 11:05    Procedures Procedures (including critical care time)  Medications Ordered in ED Medications  rivaroxaban (XARELTO) Education Kit for DVT/PE patients (has no administration in time range)  Rivaroxaban (XARELTO) tablet 15 mg (has no administration in time range)    ED Course  I have reviewed the triage vital signs and the nursing notes.  Pertinent labs & imaging results that were available during my care of the patient were reviewed by me and considered in my medical decision making (see chart for details).    MDM Rules/Calculators/A&P                      Patient is well-appearing overall.  Vital signs are stable besides hypertension.  She is hyperglycemic but no acidosis.  CT scan was personally reviewed and report was reviewed.  I have also reviewed the labs and incorporated these in my medical decision-making.  I think her hemoptysis, while small volume, is more related to the diffuse metastatic disease in her lungs.  I think these pulmonary emboli are very small and unlikely to be contributing to her symptoms today.  However with her active metastatic cancer she is also highly likely to develop more blood clots.  Thus with shared decision making I discussed pros and cons of Xarelto.  She has decided to start Xarelto to help prevent further PE.  She is not interested in following up with oncology.  She seems understand this could result in death.  She was offered observation but declines.  Will discharge. Final Clinical Impression(s) / ED  Diagnoses Final diagnoses:  Malignant neoplasm metastatic to lung, unspecified laterality ([place])  Multiple subsegmental pulmonary emboli without acute cor pulmonale ([place])    Rx / DC Orders ED Discharge Orders         Ordered    RIVAROXABAN (XARELTO) VTE STARTER PACK (15 & 20 MG TABLETS)     01/15/20 1618           Sherwood Gambler, MD 01/15/20 1626

## 2020-01-15 NOTE — Telephone Encounter (Signed)
Unable to reach pt at listed phone [phone]. Called emergency contact phone number / reach pt niece, pt with her . DOB and name verified. Instructed pt and nice to walk in immediately in the ED as per PCP CT results. Educated pt this could be possible life threatening  and require immediate attention. Stated she will go to [facility] emergency department ASAP.

## 2020-01-15 NOTE — Discharge Instructions (Addendum)
You are being placed on a blood thinner called Xarelto.  This can cause spontaneous or traumatic bleeding.  This may make any amount of bleeding worse.  If at any point you notice new or worsening bleeding, stop the medicine and call your doctor and/or seek emergent medical care.  Follow-up closely with your primary care physician.  Do not take Ibuprofen/Advil/Aleve/Motrin/Goody Powders/Naproxen/BC powders/Meloxicam/Diclofenac/Indomethacin and other Nonsteroidal anti-inflammatory medications    Se le administrar un anticoagulante llamado Xarelto. Esto puede provocar una hemorragia espontnea o traumtica. Esto puede empeorar cualquier cantidad de sangrado. Si en algn momento nota un sangrado nuevo o que empeora, deje de tomar el medicamento y llame a su mdico y / o busque atencin mdica de [employer]. Realice un seguimiento de cerca con su mdico de atencin primaria.  No tome Ibuprofeno / Advil / Aleve / Motrin / Polvos Goody / Naproxeno / Polvos BC / Meloxicam / Diclofenaco / Indometacina y otros medicamentos antiinflamatorios no esteroideos

## 2020-01-15 NOTE — Telephone Encounter (Signed)
Diane from [facility] radiology called would like PCP to look at the CT scan imaging.  Reported the imaging to PCP. PCP inform patient to go to emergency department as soon as possible due to clot in her lung which could be life threatening.   Will call patient.

## 2020-01-23 ENCOUNTER — Telehealth: Payer: Self-pay | Admitting: Internal Medicine

## 2020-01-23 NOTE — Telephone Encounter (Signed)
Called pt again per 4/12 sch message - pt is aware of appt date and time    Interp ID#  [id number] ( Interp Name-  [place] )

## 2020-01-23 NOTE — Telephone Encounter (Signed)
Called pt per 4/12 sch message - unable to reach pt .  No vmail set up   Interpreter used .

## 2020-01-29 ENCOUNTER — Ambulatory Visit: Payer: No Typology Code available for payment source

## 2020-01-31 ENCOUNTER — Inpatient Hospital Stay: Payer: Self-pay | Attending: Internal Medicine | Admitting: Internal Medicine

## 2020-01-31 ENCOUNTER — Telehealth: Payer: Self-pay | Admitting: Medical Oncology

## 2020-01-31 ENCOUNTER — Encounter: Payer: Self-pay | Admitting: Internal Medicine

## 2020-01-31 ENCOUNTER — Other Ambulatory Visit: Payer: Self-pay

## 2020-01-31 VITALS — BP 175/106 | HR 99 | Temp 98.7°F | Resp 20 | Ht 59.0 in | Wt 135.0 lb

## 2020-01-31 DIAGNOSIS — J479 Bronchiectasis, uncomplicated: Secondary | ICD-10-CM | POA: Insufficient documentation

## 2020-01-31 DIAGNOSIS — Z85850 Personal history of malignant neoplasm of thyroid: Secondary | ICD-10-CM | POA: Insufficient documentation

## 2020-01-31 DIAGNOSIS — I10 Essential (primary) hypertension: Secondary | ICD-10-CM | POA: Insufficient documentation

## 2020-01-31 DIAGNOSIS — R042 Hemoptysis: Secondary | ICD-10-CM | POA: Insufficient documentation

## 2020-01-31 DIAGNOSIS — Z809 Family history of malignant neoplasm, unspecified: Secondary | ICD-10-CM | POA: Insufficient documentation

## 2020-01-31 DIAGNOSIS — Z803 Family history of malignant neoplasm of breast: Secondary | ICD-10-CM | POA: Insufficient documentation

## 2020-01-31 DIAGNOSIS — E890 Postprocedural hypothyroidism: Secondary | ICD-10-CM | POA: Insufficient documentation

## 2020-01-31 DIAGNOSIS — R591 Generalized enlarged lymph nodes: Secondary | ICD-10-CM | POA: Insufficient documentation

## 2020-01-31 DIAGNOSIS — C73 Malignant neoplasm of thyroid gland: Secondary | ICD-10-CM | POA: Insufficient documentation

## 2020-01-31 DIAGNOSIS — C7802 Secondary malignant neoplasm of left lung: Secondary | ICD-10-CM | POA: Insufficient documentation

## 2020-01-31 DIAGNOSIS — C7801 Secondary malignant neoplasm of right lung: Secondary | ICD-10-CM | POA: Insufficient documentation

## 2020-01-31 DIAGNOSIS — R599 Enlarged lymph nodes, unspecified: Secondary | ICD-10-CM

## 2020-01-31 DIAGNOSIS — E119 Type 2 diabetes mellitus without complications: Secondary | ICD-10-CM | POA: Insufficient documentation

## 2020-01-31 DIAGNOSIS — R0609 Other forms of dyspnea: Secondary | ICD-10-CM | POA: Insufficient documentation

## 2020-01-31 DIAGNOSIS — R0789 Other chest pain: Secondary | ICD-10-CM | POA: Insufficient documentation

## 2020-01-31 DIAGNOSIS — Z79899 Other long term (current) drug therapy: Secondary | ICD-10-CM | POA: Insufficient documentation

## 2020-01-31 DIAGNOSIS — M549 Dorsalgia, unspecified: Secondary | ICD-10-CM | POA: Insufficient documentation

## 2020-01-31 DIAGNOSIS — R911 Solitary pulmonary nodule: Secondary | ICD-10-CM

## 2020-01-31 DIAGNOSIS — R634 Abnormal weight loss: Secondary | ICD-10-CM | POA: Insufficient documentation

## 2020-01-31 DIAGNOSIS — Z7901 Long term (current) use of anticoagulants: Secondary | ICD-10-CM | POA: Insufficient documentation

## 2020-01-31 DIAGNOSIS — Z8249 Family history of ischemic heart disease and other diseases of the circulatory system: Secondary | ICD-10-CM | POA: Insufficient documentation

## 2020-01-31 DIAGNOSIS — Z794 Long term (current) use of insulin: Secondary | ICD-10-CM | POA: Insufficient documentation

## 2020-01-31 DIAGNOSIS — R5383 Other fatigue: Secondary | ICD-10-CM | POA: Insufficient documentation

## 2020-01-31 DIAGNOSIS — R0781 Pleurodynia: Secondary | ICD-10-CM | POA: Insufficient documentation

## 2020-01-31 DIAGNOSIS — Z833 Family history of diabetes mellitus: Secondary | ICD-10-CM | POA: Insufficient documentation

## 2020-01-31 MED ORDER — CLONIDINE HCL 0.1 MG PO TABS
0.2000 mg | ORAL_TABLET | Freq: Once | ORAL | Status: AC
  Administered 2020-01-31: 0.2 mg via ORAL

## 2020-01-31 MED ORDER — CLONIDINE HCL 0.1 MG PO TABS
ORAL_TABLET | ORAL | Status: AC
  Filled 2020-01-31: qty 2

## 2020-01-31 NOTE — Telephone Encounter (Signed)
Hypertension 174/114-pt received clonidine 0.2 mg and bp still high after 40 minutes 175/105. Pt said she has to see Dr Raul Del for refill for BP medications.

## 2020-01-31 NOTE — Telephone Encounter (Signed)
She has 6 months of refills already on her BP medication. I have not declined any BP medications.

## 2020-01-31 NOTE — Progress Notes (Signed)
[facility] Telephone:([phone]   Fax:([phone]) [phone]  CONSULT NOTE  REFERRING PHYSICIAN: Geryl Rankins, NP  REASON FOR CONSULTATION:  54 years old Hispanic female with metastatic thyroid cancer.  HPI Jenny [name] is a 54 y.o. female with past medical history significant for diabetes mellitus, hypertension as well as history of metastatic papillary thyroid carcinoma diagnosed initially in 2008 in [place] under the care of Dr. Heath Gold. She underwent thyroidectomy with bilateral neck dissection in June 2012. She was found at that time to have evidence for metastatic disease with bilateral pulmonary nodules. The patient was treated with radioactive iodine in March 2013. She was also found to have disease recurrence in the thyroid bed and underwent reresection with another course of radioactive iodine in January 2014. The patient most to [facility] and 2015 and has been seen by Dr. Lennart Pall at [facility]. She has been on treatment with Synthroid for TSH suppression and has been doing fine with this treatment with no evidence for disease progression.  I saw the patient 1 time in December 2016 and recommended for her to continue her treatment under the care of Dr. Mariel Kansky.  The patient moved back to [place] and it looks she was started on oral medication for the progressive thyroid cancer.  The type of medication and unknown to me at this point and the patient does not remember the name of this medication.  She took the oral drug for only 2 weeks but discontinued on her own because of tolerability issues.  Her last dose was in May 2019.  She has pain with no treatment over the last 2 years.  She presented recently to the emergency department at [facility] complaining of chest discomfort, shortness of breath and hemoptysis.  She had CT angiogram scan of the chest on 01/15/2020 and it showed small nonocclusive bilateral upper lobe segmental pulmonary emboli.  The  scan also showed progressive widespread pulmonary and nodal metastatic disease with new right sided 2.5 cm soft tissue mass anterior to the thyroidectomy.  Consistent with recurrent primary or metastatic disease. The patient was referred back to me today for evaluation and recommendation regarding her condition. When seen today she continues to complain of the shortness of breath and cough as well as the chest discomfort.  She lost around 25 pounds in the last few months.  She also has intermittent back pain and she takes Tylenol and ibuprofen as needed.  Her blood pressure is not controlled and she is not taking her blood pressure medication as recommended. She denied having any fever or chills.  She has no nausea, vomiting, diarrhea or constipation.  She denied having any headache or visual changes. Family history significant for a sister with breast cancer.  Her mother also had cancer and she lost her son recently. The patient is single and has 3 children.  She is currently on disability.  She has no history of smoking, alcohol or drug abuse.  HPI  Past Medical History:  Diagnosis Date  . Diabetes mellitus without complication ([place])   . Hypertension   . Metastatic disease ([place])   . Papillary thyroid carcinoma ([place]) 2008    Past Surgical History:  Procedure Laterality Date  . CESAREAN SECTION    . LEFT HEART CATHETERIZATION WITH CORONARY ANGIOGRAM N/A 10/23/2014   Procedure: LEFT HEART CATHETERIZATION WITH CORONARY ANGIOGRAM;  Surgeon: Larey Dresser, MD;  Location: [facility] CATH LAB;  Service: Cardiovascular;  Laterality: N/A;  .  THYROIDECTOMY  03/2011    Family History  Problem Relation Age of Onset  . Diabetes Mother   . Hypertension Mother   . Diabetes Sister   . Cancer Sister   . Hypertension Sister   . Breast cancer Sister 71    Social History Social History   Tobacco Use  . Smoking status: Never Smoker  . Smokeless tobacco: Never Used  Substance Use Topics  . Alcohol use:  No  . Drug use: Never    No Known Allergies  Current Outpatient Medications  Medication Sig Dispense Refill  . amLODipine (NORVASC) 5 MG tablet Take 1 tablet (5 mg total) by mouth daily. (Patient not taking: Reported on 01/09/2020) 90 tablet 1  . atorvastatin (LIPITOR) 20 MG tablet Take 1 tablet (20 mg total) by mouth daily. (Patient not taking: Reported on 01/09/2020) 90 tablet 3  . Blood Glucose Monitoring Suppl (TRUE METRIX METER) w/Device KIT 1 each by Does not apply route as needed. (Patient not taking: Reported on 01/09/2020) 1 kit 0  . Cholecalciferol (VITAMIN D3) 10 MCG (400 UNIT) tablet Take 800-1,600 Units by mouth daily.     Marland Kitchen escitalopram (LEXAPRO) 10 MG tablet Take 1 tablet (10 mg total) by mouth daily. (Patient not taking: Reported on 01/15/2020) 90 tablet 1  . gabapentin (NEURONTIN) 300 MG capsule Take 1 capsule (300 mg total) by mouth at bedtime. (Patient not taking: Reported on 11/13/2019) 90 capsule 1  . glucose blood (TRUE METRIX BLOOD GLUCOSE TEST) test strip 1 each by Other route 3 (three) times daily. (Patient not taking: Reported on 01/09/2020) 100 each 11  . ibuprofen (ADVIL) 600 MG tablet Take 600 mg by mouth 2 (two) times daily as needed.     . Insulin Pen Needle (B-D UF III MINI PEN NEEDLES) 31G X 5 MM MISC Use as instructed. Inject into the skin once daily (Patient not taking: Reported on 01/09/2020) 100 each 6  . levothyroxine (SYNTHROID) 300 MCG tablet Take 1 tablet (300 mcg total) by mouth daily before breakfast. 30 tablet 1  . losartan (COZAAR) 100 MG tablet Take 1 tablet (100 mg total) by mouth daily. 90 tablet 1  . metFORMIN (GLUCOPHAGE) 1000 MG tablet Take 1 tablet (1,000 mg total) by mouth 2 (two) times daily with a meal. 180 tablet 1  . RIVAROXABAN (XARELTO) VTE STARTER PACK (15 & 20 MG TABLETS) Follow package directions: Take one 94m tablet by mouth twice a day. On day 22, switch to one 278mtablet once a day. Take with food. 51 each 0  . TRUEplus Lancets 28G MISC 1  each by Does not apply route 3 (three) times daily. Use as instructed. Check blood glucose level by fingerstick three times per day.  E11.65 (Patient not taking: Reported on 01/09/2020) 100 each 11   Current Facility-Administered Medications  Medication Dose Route Frequency Provider Last Rate Last Admin  . cloNIDine (CATAPRES) tablet 0.2 mg  0.2 mg Oral Once MoCurt BearsMD        Review of Systems  Constitutional: positive for fatigue and weight loss Eyes: negative Ears, nose, mouth, throat, and face: negative Respiratory: positive for cough, dyspnea on exertion and pleurisy/chest pain Cardiovascular: negative Gastrointestinal: negative Genitourinary:negative Integument/breast: negative Hematologic/lymphatic: negative Musculoskeletal:negative Neurological: negative Behavioral/Psych: negative Endocrine: negative Allergic/Immunologic: negative  Physical Exam  RAZJQ:BHALPhealthy, no distress, well nourished, well developed and anxious SKIN: skin color, texture, turgor are normal, no rashes or significant lesions HEAD: Normocephalic, No masses, lesions, tenderness or abnormalities EYES: normal,  PERRLA, Conjunctiva are pink and non-injected EARS: External ears normal, Canals clear OROPHARYNX:no exudate, no erythema and lips, buccal mucosa, and tongue normal  NECK: supple, no adenopathy, no JVD LYMPH:  no palpable lymphadenopathy, no hepatosplenomegaly BREAST:not examined LUNGS: scattered rhonchi bilaterally, scattered rales bilaterally HEART: regular rate & rhythm, no murmurs and no gallops ABDOMEN:abdomen soft, non-tender, normal bowel sounds and no masses or organomegaly BACK: No CVA tenderness, Range of motion is normal EXTREMITIES:no joint deformities, effusion, or inflammation, no edema  NEURO: alert & oriented x 3 with fluent speech, no focal motor/sensory deficits  PERFORMANCE STATUS: ECOG 1  LABORATORY DATA: Lab Results  Component Value Date   WBC 8.5 01/15/2020    HGB 13.5 01/15/2020   HCT 39.9 01/15/2020   MCV 89.1 01/15/2020   PLT 219 01/15/2020      Chemistry      Component Value Date/Time   NA 132 (L) 01/15/2020 1350   NA 135 11/14/2019 0900   NA 139 09/16/2015 1348   K 3.9 01/15/2020 1350   K 4.0 09/16/2015 1348   CL 90 (L) 01/15/2020 1350   CO2 30 01/15/2020 1350   CO2 28 09/16/2015 1348   BUN 9 01/15/2020 1350   BUN 11 11/14/2019 0900   BUN 9.6 09/16/2015 1348   CREATININE 0.81 01/15/2020 1350   CREATININE 0.8 09/16/2015 1348      Component Value Date/Time   CALCIUM 9.1 01/15/2020 1350   CALCIUM 9.5 09/16/2015 1348   ALKPHOS 114 01/15/2020 1350   ALKPHOS 156 (H) 09/16/2015 1348   AST 19 01/15/2020 1350   AST 19 09/16/2015 1348   ALT 15 01/15/2020 1350   ALT 23 09/16/2015 1348   BILITOT 1.1 01/15/2020 1350   BILITOT 0.5 11/14/2019 0900   BILITOT 0.66 09/16/2015 1348       RADIOGRAPHIC STUDIES: CT Angio Chest W/Cm &/Or Wo Cm  Result Date: 01/15/2020 CLINICAL DATA:  Right-sided pleuritic chest pain and back pain for the past 2 weeks. History of metastatic thyroid cancer. EXAM: CT ANGIOGRAPHY CHEST WITH CONTRAST TECHNIQUE: Multidetector CT imaging of the chest was performed using the standard protocol during bolus administration of intravenous contrast. Multiplanar CT image reconstructions and MIPs were obtained to evaluate the vascular anatomy. CONTRAST:  44m OMNIPAQUE IOHEXOL 350 MG/ML SOLN COMPARISON:  Chest x-ray dated September 03, 2016. CTA chest dated October 21, 2014. FINDINGS: Cardiovascular: Satisfactory opacification of the pulmonary arteries to the segmental level. Small nonocclusive filling defects in the bilateral upper lobe segmental pulmonary arteries (series 6, image 48). No additional pulmonary emboli. Narrowing of the left anterior and apical segmental pulmonary arteries due to metastatic disease. Mildly enlarged main pulmonary artery measuring up to 3.3 cm in diameter. Mild cardiomegaly. No pericardial  effusion. No thoracic aortic aneurysm. Mediastinum/Nodes: Multiple enlarged right hilar lymph nodes measuring up to 1.8 cm in short axis, new since the prior study. Right paratracheal lymph node measuring up to 9 mm in short axis. Left paratracheal lymph node measuring 1.9 cm in short axis. No enlarged axillary lymph nodes. Prior thyroidectomy. 2.5 x 2.3 cm soft tissue mass on the right anterior to the thyroidectomy bed. The trachea and esophagus demonstrate no significant findings. Lungs/Pleura: Innumerable pulmonary nodules and masses throughout both lungs, increased in size and number since 2016. Scattered perilymphatic nodularity, most prominent in the left upper lobe. Nodular thickening of the right major and minor fissures. Mild bronchiectasis in the anterior left upper lobe. No focal consolidation, pleural effusion, or pneumothorax. Upper Abdomen: No acute  abnormality. Musculoskeletal: No chest wall abnormality. No acute or significant osseous findings. Review of the MIP images confirms the above findings. IMPRESSION: 1. Small nonocclusive bilateral upper lobe segmental pulmonary emboli. 2. Progressive widespread pulmonary and nodal metastatic disease. 3. New right-sided 2.5 cm soft tissue mass anterior to the thyroidectomy bed consistent with recurrent primary or metastatic disease. These results will be called to the ordering clinician or representative by the Radiologist Assistant, and communication documented in the PACS or [employer]. Electronically Signed   By: Titus Dubin M.D.   On: 01/15/2020 11:05    ASSESSMENT: This is a very pleasant 54 years old Hispanic female with extensive metastatic papillary thyroid carcinoma with multiple bilateral pulmonary nodules and masses as well as lymphadenopathy and a new suspicious recurrent local disease at the thyroidectomy..  Her cancer was initially diagnosed in 2008.   PLAN: I had a lengthy discussion with the patient and her nephew who was  available by phone during the visit.  The patient was also accompanied by her Spanish interpreter. I personally and independently reviewed the scan images and discussed the result and showed the images to the patient today. Unfortunately her disease has progressed to the point that unlikely to be reversed to a manageable degree.  I discussed with the patient the option of palliative care and hospice referral versus consideration of molecular studies by guardant 360 blood test to see if the patient has RET fusion, which could be targeted with RET tyrosine kinase inhibitor. The patient indicated that she is not interested in treatment but she would like to take some time to think about her option with her family. For the hypertension I gave the patient a dose of clonidine 0.2 mg p.o. x1 today and she was advised to reach out to her primary care physician for evaluation and management of her hypertension. If the patient decided to proceed with palliative care and hospice, she will need a referral from her primary care physician for this service. I will keep her appointment open for now until she makes a decision. She was advised to call if she has any other concerning symptoms in the interval.  The patient voices understanding of current disease status and treatment options and is in agreement with the current care plan.  All questions were answered. The patient knows to call the clinic with any problems, questions or concerns. We can certainly see the patient much sooner if necessary.  Thank you so much for allowing me to participate in the care of [facility]. I will continue to follow up the patient with you and assist in her care.  The total time spent in the appointment was 60 minutes.  Disclaimer: This note was dictated with voice recognition software. Similar sounding words can inadvertently be transcribed and may not be corrected upon review.   Eilleen Kempf January 31, 2020, 11:57  AM

## 2020-02-01 NOTE — Telephone Encounter (Signed)
Per Dr Raul Del , I lvm for Jenny [name] that Dr Raul Del has not declined any refills for Bp meds.

## 2020-02-06 ENCOUNTER — Other Ambulatory Visit: Payer: Self-pay

## 2020-02-06 ENCOUNTER — Telehealth: Payer: Self-pay | Admitting: *Deleted

## 2020-02-06 ENCOUNTER — Encounter: Payer: Self-pay | Admitting: Pharmacist

## 2020-02-06 ENCOUNTER — Ambulatory Visit: Payer: Self-pay | Attending: Nurse Practitioner | Admitting: Pharmacist

## 2020-02-06 VITALS — BP 177/123 | HR 83

## 2020-02-06 DIAGNOSIS — I10 Essential (primary) hypertension: Secondary | ICD-10-CM

## 2020-02-06 MED FILL — LOSARTAN POTASSIUM 100 MG T: 100 | 30 days supply | Qty: 30 | Fill #0

## 2020-02-06 MED FILL — LEVOTHYROXINE SODIUM 300 MC: 300 | 30 days supply | Qty: 30 | Fill #0

## 2020-02-06 MED FILL — metFORMIN HCL 1000 MG TABS: 1000 | 30 days supply | Qty: 60 | Fill #0

## 2020-02-06 NOTE — Telephone Encounter (Signed)
I called patient with interpretor services.  Patient will call back tomorrow with an update if she would like to receive treatment.

## 2020-02-06 NOTE — Telephone Encounter (Signed)
I called patient to follow up about her thoughts on treatment. She states she needs interpretor. I will get one and call her back.

## 2020-02-06 NOTE — Progress Notes (Signed)
   S:    Patient arrives in poor spirits. Presents to the clinic for BP check.  Patient was referred and last seen by Primary Care Provider on 01/09/2020.   Today, pt reports shortness of breath. Denies chest pain or tightness. "My heart is okay"  Denies blurred vision, jaw or arm pain. Denies dizziness or HA..   Medication adherence: confirms.  Current BP Medications include:  Amlodipine 5 mg daily, losartan 100 mg daily   O:  Vitals:   02/06/20 1652  BP: (!) 177/123  Pulse: 83  SpO2: 97%    Home BP readings: none   Last 3 Office BP readings: BP Readings from Last 3 Encounters:  02/06/20 (!) 177/123  01/31/20 (!) 175/106  01/15/20 (!) 148/91    BMET    Component Value Date/Time   NA 132 (L) 01/15/2020 1350   NA 135 11/14/2019 0900   NA 139 09/16/2015 1348   K 3.9 01/15/2020 1350   K 4.0 09/16/2015 1348   CL 90 (L) 01/15/2020 1350   CO2 30 01/15/2020 1350   CO2 28 09/16/2015 1348   GLUCOSE 391 (H) 01/15/2020 1350   GLUCOSE 269 (H) 09/16/2015 1348   BUN 9 01/15/2020 1350   BUN 11 11/14/2019 0900   BUN 9.6 09/16/2015 1348   CREATININE 0.81 01/15/2020 1350   CREATININE 0.8 09/16/2015 1348   CALCIUM 9.1 01/15/2020 1350   CALCIUM 9.5 09/16/2015 1348   GFRNONAA >60 01/15/2020 1350   GFRNONAA >89 02/26/2015 1244   GFRAA >60 01/15/2020 1350   GFRAA >89 02/26/2015 1244    Renal function: CrCl cannot be calculated (Patient's most recent lab result is older than the maximum 21 days allowed.).  Clinical ASCVD: No  The 10-year ASCVD risk score Mikey Bussing DC Jr., et al., 2013) is: 14.4%   Values used to calculate the score:     Age: 54 years     Sex: Female     Is Non-Hispanic African American: No     Diabetic: Yes     Tobacco smoker: No     Systolic Blood Pressure: 123XX123 mmHg     Is BP treated: Yes     HDL Cholesterol: 50 mg/dL     Total Cholesterol: 275 mg/dL   A/P: Hypertensive urgency with no chest pain or HA. Does endorse shortness of breath. I have advised her  to go to the ED for further management but she declines. She does have a PCP appointment for 02/11/2020.  Results reviewed and written information provided.   Total time in face-to-face counseling 15 minutes.   F/U Clinic Visit 02/11/2020.   Benard Halsted, PharmD, [place] [phone]

## 2020-02-11 ENCOUNTER — Other Ambulatory Visit: Payer: Self-pay

## 2020-02-11 ENCOUNTER — Encounter: Payer: Self-pay | Admitting: Nurse Practitioner

## 2020-02-11 ENCOUNTER — Ambulatory Visit: Payer: Self-pay | Attending: Nurse Practitioner | Admitting: Nurse Practitioner

## 2020-02-11 ENCOUNTER — Telehealth: Payer: Self-pay

## 2020-02-11 DIAGNOSIS — M2550 Pain in unspecified joint: Secondary | ICD-10-CM

## 2020-02-11 DIAGNOSIS — R0602 Shortness of breath: Secondary | ICD-10-CM

## 2020-02-11 NOTE — Progress Notes (Signed)
Virtual Visit via Telephone Note Due to national recommendations of social distancing due to [place] 19, telehealth visit is felt to be most appropriate for this patient at this time.  I discussed the limitations, risks, security and privacy concerns of performing an evaluation and management service by telephone and the availability of in person appointments. I also discussed with the patient that there may be a patient responsible charge related to this service. The patient expressed understanding and agreed to proceed.    I connected with Gorden Harms on 02/11/20  at   3:50 PM EDT  EDT by telephone and verified that I am speaking with the correct person using two identifiers.   Consent I discussed the limitations, risks, security and privacy concerns of performing an evaluation and management service by telephone and the availability of in person appointments. I also discussed with the patient that there may be a patient responsible charge related to this service. The patient expressed understanding and agreed to proceed.   Location of Patient: Private Residence    Location of Provider: [place] [facility] [place] participating in Telemedicine visit: Geryl Rankins FNP-BC [place] Interpreter ID# [id number] Niece: Judie Grieve.    History of Present Illness: Telemedicine visit for: Shortness of Breath  Endorses increased shortness of breath, worse at night with lying down, bones are painful all over.  She has metastatic lung CA and multiple PE.  She was referred to oncology with evaluation as noted on [id number]  I had a lengthy discussion with the patient and her nephew who was available by phone during the visit.  The patient was also accompanied by her Spanish interpreter. I personally and independently reviewed the scan images and discussed the result and showed the images to the patient today. Unfortunately her disease has progressed to  the point that unlikely to be reversed to a manageable degree.  I discussed with the patient the option of palliative care and hospice referral versus consideration of molecular studies by guardant 360 blood test to see if the patient has RET fusion, which could be targeted with RET tyrosine kinase inhibitor. The patient indicated that she is not interested in treatment but she would like to take some time to think about her option with her family. For the hypertension I gave the patient a dose of clonidine 0.2 mg p.o. x1 today and she was advised to reach out to her primary care physician for evaluation and management of her hypertension. If the patient decided to proceed with palliative care and hospice, she will need a referral from her primary care physician for this service. I will keep her appointment open for now until she makes a decision. She was advised to call if she has any other concerning symptoms in the interval.  The patient voices understanding of current disease status and treatment options and is in agreement with the current care plan  Today her niece states they can not afford the treatment options. I have asked ms. Constantin what her wishes are at this time and she is interested in palliative care/hospice. I will place referral. She is not taking most of her medications including her xarelto. She is aware of the risks and side effects that can occur with medication nonadherence    Past Medical History:  Diagnosis Date  . Diabetes mellitus without complication ([place])   . Hypertension   . Metastatic disease ([place])   . Papillary thyroid carcinoma ([place])  2008    Past Surgical History:  Procedure Laterality Date  . CESAREAN SECTION    . LEFT HEART CATHETERIZATION WITH CORONARY ANGIOGRAM N/A 10/23/2014   Procedure: LEFT HEART CATHETERIZATION WITH CORONARY ANGIOGRAM;  Surgeon: Larey Dresser, MD;  Location: [facility] CATH LAB;  Service: Cardiovascular;  Laterality: N/A;  . THYROIDECTOMY   03/2011    Family History  Problem Relation Age of Onset  . Diabetes Mother   . Hypertension Mother   . Diabetes Sister   . Cancer Sister   . Hypertension Sister   . Breast cancer Sister 46    Social History   Socioeconomic History  . Marital status: Single    Spouse name: Not on file  . Number of children: 3  . Years of education: Not on file  . Highest education level: 6th grade  Occupational History  . Not on file  Tobacco Use  . Smoking status: Never Smoker  . Smokeless tobacco: Never Used  Substance and Sexual Activity  . Alcohol use: No  . Drug use: Never  . Sexual activity: Not Currently  Other Topics Concern  . Not on file  Social History Narrative   Has 3 children   54 yo son with cystic fibrosis   29 yo daughter   66 yo child   Also care for 86 yo grandson    Social Determinants of Radio broadcast assistant Strain:   . Difficulty of Paying Living Expenses:   Food Insecurity:   . Worried About Charity fundraiser in the Last Year:   . Arboriculturist in the Last Year:   Transportation Needs: Unmet Transportation Needs  . Lack of Transportation (Medical): Yes  . Lack of Transportation (Non-Medical): Yes  Physical Activity:   . Days of Exercise per Week:   . Minutes of Exercise per Session:   Stress:   . Feeling of Stress :   Social Connections:   . Frequency of Communication with Friends and Family:   . Frequency of Social Gatherings with Friends and Family:   . Attends Religious Services:   . Active Member of Clubs or Organizations:   . Attends Archivist Meetings:   Marland Kitchen Marital Status:      Observations/Objective: Awake, alert and oriented x 3   Review of Systems  Constitutional: Negative for fever, malaise/fatigue and weight loss.  HENT: Negative.  Negative for nosebleeds.   Eyes: Negative.  Negative for blurred vision, double vision and photophobia.  Respiratory: Positive for shortness of breath. Negative for cough.    Cardiovascular: Negative.  Negative for chest pain, palpitations and leg swelling.  Gastrointestinal: Negative.  Negative for heartburn, nausea and vomiting.  Musculoskeletal: Positive for joint pain. Negative for myalgias.  Neurological: Negative.  Negative for dizziness, focal weakness, seizures and headaches.  Psychiatric/Behavioral: Negative.  Negative for suicidal ideas.    Assessment and Plan: Synia was seen today for breathing problem.  Diagnoses and all orders for this visit:  Shortness of breath Arthralgia of multiple joints Referred to palliative care for evaluation     Follow Up Instructions Return if symptoms worsen or fail to improve.     I discussed the assessment and treatment plan with the patient. The patient was provided an opportunity to ask questions and all were answered. The patient agreed with the plan and demonstrated an understanding of the instructions.   The patient was advised to call back or seek an in-person evaluation if the symptoms worsen  or if the condition fails to improve as anticipated.  I provided 19 minutes of non-face-to-face time during this encounter including median intraservice time, reviewing previous notes, labs, imaging, medications and explaining diagnosis and management.  Gildardo Pounds, FNP-BC

## 2020-02-12 ENCOUNTER — Telehealth: Payer: Self-pay | Admitting: Internal Medicine

## 2020-02-12 NOTE — Telephone Encounter (Signed)
Scheduled [facility] Palliative visit for 02-15-20 at 1:30.

## 2020-02-14 NOTE — Telephone Encounter (Signed)
Opened in error

## 2020-02-15 ENCOUNTER — Other Ambulatory Visit: Payer: Self-pay

## 2020-02-15 ENCOUNTER — Other Ambulatory Visit: Payer: Self-pay | Admitting: Internal Medicine

## 2020-02-15 ENCOUNTER — Other Ambulatory Visit: Payer: Self-pay | Admitting: Nurse Practitioner

## 2020-02-15 DIAGNOSIS — C73 Malignant neoplasm of thyroid gland: Secondary | ICD-10-CM

## 2020-02-15 DIAGNOSIS — Z515 Encounter for palliative care: Secondary | ICD-10-CM

## 2020-02-15 MED ORDER — TRAMADOL HCL 50 MG PO TABS: 50.0000 mg | ORAL_TABLET | Freq: Three times a day (TID) | ORAL | 0 refills | Status: DC | PRN

## 2020-02-15 NOTE — Progress Notes (Signed)
Received call from NP Gonzella Lex with Palliative care. At this time Ms. Jenny [name] has concerns of  shortness of breath which is worse at night. BP is elevated today as well. SATS WNL. She is only taking metformin, levothyroxine and losartan.  Not taking lexapro, amlodipine or xarelto.  Will send Tramadol and Verbal order given for Hospice which the patient is currently requesting.

## 2020-02-18 NOTE — Progress Notes (Signed)
[facility] Consult Note Telephone: [phone]  Fax: [phone] PATIENT NAME: Jenny [name] DOB: 04/18/1966 MRN: [id number]  PRIMARY CARE PROVIDER:   Gildardo Pounds, NP  REFERRING PROVIDER:  Gildardo Pounds, NP [place],  [place] [address]  RESPONSIBLE PARTY:   Self  Patient's niece  Linward Foster,  functioned as the Spanish interpreter for the entire visit [phone]  With patient's permission    RECOMMENDATIONS and PLAN:  Palliative care encounter Z51.5  1.  Advance care planning:  Reiewed MOST form and confirmed pt's selections with her.  Her advanced directives are , attempt CPR , trial IV fluids,  determine use of antibiotics, no tube feedings. MOST forms was signed and uploaded into chart. Palliative care will continue to follow until transition to Hospice care.   with patient.  Patient's goals are to spend quality time with her family at home.  She does not desire to pursue any additional treatment for cancer.  She would like to improve her breathing and pain as much as possible.  Telephone conversation with PCP nurse practitioner Raul Del and Dr. Tomasa Hosteller who agree that patient is eligible to receive hospice care based on her terminal diagnosis of metastatic thyroid cancer and associated pulmonary emboli. No plans for treatment.  [facility] referral order was sent to the [facility] referral center.  2..  Shortness of breath: Multifactorial.  Associated with metastasis to lungs, untreated pulmonary emboli(pt did not purchase anticoagulant(Xarelto) due to cost,  chest pain and anxiety.  Oxygen saturations are stable today at 96% and did not decrease below 94% after exertion.  We will reevaluate respiratory status to make determination for possible need for home supplemental oxygen. Potential treatment of dyspnea with Roxanol.  Further evaluation per hospice nurse.  3.  Anxiety:   Encouraged to purchase and begin  Escitalopram 50m daily supply that was prescribed 01/12/20.  Begin rx of Amlodipine 574mdaily as previously prescribed Relaxation techniques discussed.   I spent 90  minutes providing this consultation,  from 1330 to 1500. More than 50% of the time in this consultation was spent coordinating communication.   HISTORY OF PRESENT ILLNESS:  Jenny [name] a [age].o. year old female with multiple medical problems including type 2 diabetes,HTN and thyroid cancer.  She previously received radiation and chemotherapy treatments for cancer.  She was sent to local ER by her PCP due to hemoptysis, shortness of breath. She remains ambulatory and is self sufficient with her ADLs.   Palliative Care was asked to help address goals of care.   CODE STATUS: Attempt CPR  PPS: 50% HOSPICE ELIGIBILITY/DIAGNOSIS: YES/ Metastatic thyroid cancer; pulmonary emboli  PAST MEDICAL HISTORY:  Past Medical History:  Diagnosis Date  . [place]  . Hypertension   . [place]astatic disease ([place]  . [place]    SOCIAL HX: Lives at home with multiple family members   ALLERGIES: No Known Allergies   PERTINENT MEDICATIONS:  Outpatient Encounter Medications as of 02/15/2020  Medication Sig  . amLODipine (NORVASC) 5 MG tablet Take 1 tablet (5 mg total) by mouth daily. (Patient not taking: Reported on 01/09/2020)  . atorvastatin (LIPITOR) 20 MG tablet Take 1 tablet (20 mg total) by mouth daily. (Patient not taking: Reported on 01/09/2020)  . Blood Glucose Monitoring Suppl (TRUE METRIX METER) w/Device KIT 1 each by Does not apply route as needed. (Patient not taking: Reported on 01/09/2020)  .  Cholecalciferol (VITAMIN D3) 10 MCG (400 UNIT) tablet Take 800-1,600 Units by mouth daily.   Marland Kitchen escitalopram (LEXAPRO) 10 MG tablet Take 1 tablet (10 mg total) by mouth daily. (Patient not taking: Reported on 01/15/2020)  . glucose blood (TRUE METRIX BLOOD GLUCOSE TEST) test strip 1  each by Other route 3 (three) times daily. (Patient not taking: Reported on 01/09/2020)  . ibuprofen (ADVIL) 600 MG tablet Take 600 mg by mouth 2 (two) times daily as needed.   . Insulin Pen Needle (B-D UF III MINI PEN NEEDLES) 31G X 5 MM MISC Use as instructed. Inject into the skin once daily (Patient not taking: Reported on 01/09/2020)  . losartan (COZAAR) 100 MG tablet Take 1 tablet (100 mg total) by mouth daily.  . metFORMIN (GLUCOPHAGE) 1000 MG tablet Take 1 tablet (1,000 mg total) by mouth 2 (two) times daily with a meal.  . RIVAROXABAN (XARELTO) VTE STARTER PACK (15 & 20 MG TABLETS) Follow package directions: Take one 3m tablet by mouth twice a day. On day 22, switch to one 215mtablet once a day. Take with food. (Patient not taking: Reported on 02/11/2020)  . traMADol (ULTRAM) 50 MG tablet Take 1 tablet (50 mg total) by mouth every 8 (eight) hours as needed.  . TRUEplus Lancets 28G MISC 1 each by Does not apply route 3 (three) times daily. Use as instructed. Check blood glucose level by fingerstick three times per day.  E11.65 (Patient not taking: Reported on 01/09/2020)   No facility-administered encounter medications on file as of 02/15/2020.    PHYSICAL EXAM:   BP 160/90 P 94 R 28 Sats 96%   General: NAD, well nourished female sitting on sofa Cardiovascular: regular rate and rhythm Pulmonary:increased work of breathing. Walked outside in attempts to ease breathing Abdomen: soft, nontender, + bowel sounds GU: no suprapubic tenderness Extremities: no edema, Skin: no rashes Neurological: Alert and oriented. Weakness but otherwise nonfocal Psych:  Anxious mood, occasionally tearful  ShGonzella LexNP-C

## 2020-02-19 ENCOUNTER — Ambulatory Visit: Payer: No Typology Code available for payment source

## 2020-02-20 ENCOUNTER — Telehealth: Payer: Self-pay | Admitting: Internal Medicine

## 2020-02-20 NOTE — Telephone Encounter (Signed)
Scheduled appt per RN [facility] staff message. Unable to reach pt . Left message with appt date and time for niece.

## 2020-03-01 ENCOUNTER — Encounter: Payer: Self-pay | Admitting: Nurse Practitioner

## 2020-03-04 ENCOUNTER — Inpatient Hospital Stay: Payer: No Typology Code available for payment source | Attending: Internal Medicine | Admitting: Physician Assistant

## 2020-03-04 ENCOUNTER — Inpatient Hospital Stay: Payer: No Typology Code available for payment source

## 2020-03-04 ENCOUNTER — Other Ambulatory Visit: Payer: Self-pay | Admitting: Physician Assistant

## 2020-03-04 DIAGNOSIS — Z79899 Other long term (current) drug therapy: Secondary | ICD-10-CM | POA: Insufficient documentation

## 2020-03-04 DIAGNOSIS — Z7984 Long term (current) use of oral hypoglycemic drugs: Secondary | ICD-10-CM | POA: Insufficient documentation

## 2020-03-04 DIAGNOSIS — Z7901 Long term (current) use of anticoagulants: Secondary | ICD-10-CM | POA: Insufficient documentation

## 2020-03-04 DIAGNOSIS — R591 Generalized enlarged lymph nodes: Secondary | ICD-10-CM | POA: Insufficient documentation

## 2020-03-04 DIAGNOSIS — Z85850 Personal history of malignant neoplasm of thyroid: Secondary | ICD-10-CM | POA: Insufficient documentation

## 2020-03-04 DIAGNOSIS — M542 Cervicalgia: Secondary | ICD-10-CM | POA: Insufficient documentation

## 2020-03-04 DIAGNOSIS — R0609 Other forms of dyspnea: Secondary | ICD-10-CM | POA: Insufficient documentation

## 2020-03-04 DIAGNOSIS — E119 Type 2 diabetes mellitus without complications: Secondary | ICD-10-CM | POA: Insufficient documentation

## 2020-03-04 DIAGNOSIS — C7801 Secondary malignant neoplasm of right lung: Secondary | ICD-10-CM | POA: Insufficient documentation

## 2020-03-04 DIAGNOSIS — I10 Essential (primary) hypertension: Secondary | ICD-10-CM | POA: Insufficient documentation

## 2020-03-04 DIAGNOSIS — C73 Malignant neoplasm of thyroid gland: Secondary | ICD-10-CM | POA: Insufficient documentation

## 2020-03-07 ENCOUNTER — Telehealth: Payer: Self-pay | Admitting: Physician Assistant

## 2020-03-07 NOTE — Telephone Encounter (Signed)
Rescheduled missed appts per phone call from [facility] interpreter [place]. Pt was not aware that she had appts. Scheduled for next available.

## 2020-03-10 ENCOUNTER — Encounter: Payer: Self-pay | Admitting: Physician Assistant

## 2020-03-10 ENCOUNTER — Inpatient Hospital Stay (HOSPITAL_BASED_OUTPATIENT_CLINIC_OR_DEPARTMENT_OTHER): Payer: No Typology Code available for payment source | Admitting: Physician Assistant

## 2020-03-10 ENCOUNTER — Inpatient Hospital Stay: Payer: No Typology Code available for payment source

## 2020-03-10 ENCOUNTER — Other Ambulatory Visit: Payer: Self-pay

## 2020-03-10 VITALS — BP 153/85 | HR 74 | Temp 98.1°F | Resp 18 | Ht 59.0 in | Wt 130.3 lb

## 2020-03-10 DIAGNOSIS — C73 Malignant neoplasm of thyroid gland: Secondary | ICD-10-CM

## 2020-03-10 DIAGNOSIS — Z7189 Other specified counseling: Secondary | ICD-10-CM | POA: Insufficient documentation

## 2020-03-10 LAB — CMP (CANCER CENTER ONLY)
ALT: 15 U/L (ref 0–44)
AST: 18 U/L (ref 15–41)
Albumin: 3.8 g/dL (ref 3.5–5.0)
Alkaline Phosphatase: 90 U/L (ref 38–126)
Anion gap: 10 (ref 5–15)
BUN: 10 mg/dL (ref 6–20)
CO2: 27 mmol/L (ref 22–32)
Calcium: 9.3 mg/dL (ref 8.9–10.3)
Chloride: 103 mmol/L (ref 98–111)
Creatinine: 0.71 mg/dL (ref 0.44–1.00)
GFR, Est AFR Am: >60 mL/min (ref >60)
GFR, Estimated: >60 mL/min (ref >60)
Glucose, Bld: 149 mg/dL — ABNORMAL HIGH (ref 70–99)
Potassium: 4 mmol/L (ref 3.5–5.1)
Sodium: 140 mmol/L (ref 135–145)
Total Bilirubin: 0.5 mg/dL (ref 0.3–1.2)
Total Protein: 7.5 g/dL (ref 6.5–8.1)

## 2020-03-10 LAB — CBC WITH DIFFERENTIAL (CANCER CENTER ONLY)
Abs Immature Granulocytes: 0.02 10*3/uL (ref 0.00–0.07)
Basophils Absolute: 0 10*3/uL (ref 0.0–0.1)
Basophils Relative: 0 %
Eosinophils Absolute: 0.2 10*3/uL (ref 0.0–0.5)
Eosinophils Relative: 2 %
HCT: 40.3 % (ref 36.0–46.0)
Hemoglobin: 13.3 g/dL (ref 12.0–15.0)
Immature Granulocytes: 0 %
Lymphocytes Relative: 12 %
Lymphs Abs: 1 10*3/uL (ref 0.7–4.0)
MCH: 29.4 pg (ref 26.0–34.0)
MCHC: 33 g/dL (ref 30.0–36.0)
MCV: 89.2 fL (ref 80.0–100.0)
Monocytes Absolute: 0.3 10*3/uL (ref 0.1–1.0)
Monocytes Relative: 4 %
Neutro Abs: 6.8 10*3/uL (ref 1.7–7.7)
Neutrophils Relative %: 82 %
Platelet Count: 241 10*3/uL (ref 150–400)
RBC: 4.52 MIL/uL (ref 3.87–5.11)
RDW: 12.7 % (ref 11.5–15.5)
WBC Count: 8.3 10*3/uL (ref 4.0–10.5)
nRBC: 0 % (ref 0.0–0.2)

## 2020-03-10 NOTE — Progress Notes (Signed)
[facility] OFFICE PROGRESS NOTE  Gildardo Pounds, NP [place] [address]  DIAGNOSIS: Extensive metastatic papillary thyroid carcinoma with multiple bilateral pulmonary nodules and masses as well as lymphadenopathy and a new suspicious recurrent local disease at the thyroidectomy..  Her cancer was initially diagnosed in 2008.  CURRENT THERAPY: Hospice   INTERVAL HISTORY: Jenny [name] 54 y.o. female returns to the clinic today for follow-up visit accompanied by her Spanish interpreter.  The patient was seen in our clinic in April 2021 for recurrent metastatic papillary thyroid carcinoma.  At this visit, the patient opted to consider her options with a referral to hospice/palliative care versus consideration of molecular studies by guardant 360 to see if she is eligible for a targeted therapy.  In the interval since her last appointment, the patient was seen by her primary care provider.  At this visit, the patient opted to move forward with hospice.  Hospice care comes by the patient's house once a week.  Today, the patient denies any recent fever, chills, night sweats, or weight loss.  Her main complaint is related to pain in the anterior neck/right side of her neck.  The patient takes tramadol and ibuprofen and reports adequate control of her pain.  She denies any chest pain, cough, or hemoptysis.  She reports mild dyspnea on exertion.  She has oxygen at home that she uses on 2L on an as-needed basis.  The patient is here today for evaluation and recommendations for management regarding her current condition.  MEDICAL HISTORY: Past Medical History:  Diagnosis Date  . Diabetes mellitus without complication ([place])   . Hypertension   . Metastatic disease ([place])   . Papillary thyroid carcinoma ([place]) 2008    ALLERGIES:  has No Known Allergies.  MEDICATIONS:  Current Outpatient Medications  Medication Sig Dispense Refill  . aspirin EC 81 MG tablet Take 81 mg by  mouth daily.    . Blood Glucose Monitoring Suppl (TRUE METRIX METER) w/Device KIT 1 each by Does not apply route as needed. 1 kit 0  . escitalopram (LEXAPRO) 10 MG tablet Take 1 tablet (10 mg total) by mouth daily. 90 tablet 1  . glucose blood (TRUE METRIX BLOOD GLUCOSE TEST) test strip 1 each by Other route 3 (three) times daily. 100 each 11  . ibuprofen (ADVIL) 600 MG tablet Take 600 mg by mouth 2 (two) times daily as needed.     . Insulin Pen Needle (B-D UF III MINI PEN NEEDLES) 31G X 5 MM MISC Use as instructed. Inject into the skin once daily 100 each 6  . levothyroxine (SYNTHROID) 300 MCG tablet Take 1 tablet (300 mcg total) by mouth daily before breakfast. 30 tablet 1  . losartan (COZAAR) 100 MG tablet Take 1 tablet (100 mg total) by mouth daily. 90 tablet 1  . metFORMIN (GLUCOPHAGE) 1000 MG tablet Take 1 tablet (1,000 mg total) by mouth 2 (two) times daily with a meal. 180 tablet 1  . traMADol (ULTRAM) 50 MG tablet Take 1 tablet (50 mg total) by mouth every 8 (eight) hours as needed. 60 tablet 0  . TRUEplus Lancets 28G MISC 1 each by Does not apply route 3 (three) times daily. Use as instructed. Check blood glucose level by fingerstick three times per day.  E11.65 100 each 11  . amLODipine (NORVASC) 5 MG tablet Take 1 tablet (5 mg total) by mouth daily. (Patient not taking: Reported on 01/09/2020) 90 tablet 1  . atorvastatin (  LIPITOR) 20 MG tablet Take 1 tablet (20 mg total) by mouth daily. (Patient not taking: Reported on 03/10/2020) 90 tablet 3  . Cholecalciferol (VITAMIN D3) 10 MCG (400 UNIT) tablet Take 800-1,600 Units by mouth daily.     Marland Kitchen gabapentin (NEURONTIN) 300 MG capsule Take 1 capsule (300 mg total) by mouth at bedtime. (Patient not taking: Reported on 11/13/2019) 90 capsule 1  . RIVAROXABAN (XARELTO) VTE STARTER PACK (15 & 20 MG TABLETS) Follow package directions: Take one 39m tablet by mouth twice a day. On day 22, switch to one 269mtablet once a day. Take with food. (Patient not  taking: Reported on 02/11/2020) 51 each 0   No current facility-administered medications for this visit.    SURGICAL HISTORY:  Past Surgical History:  Procedure Laterality Date  . CESAREAN SECTION    . LEFT HEART CATHETERIZATION WITH CORONARY ANGIOGRAM N/A 10/23/2014   Procedure: LEFT HEART CATHETERIZATION WITH CORONARY ANGIOGRAM;  Surgeon: DaLarey DresserMD;  [facility];  Service: Cardiovascular;  Laterality: N/A;  . THYROIDECTOMY  03/2011    REVIEW OF SYSTEMS:   Review of Systems  Constitutional: Negative for appetite change, chills, fatigue, fever and unexpected weight change.  HENT: Positive for anterior neck pain/right sided neck pain. Negative for mouth sores, nosebleeds, sore throat and trouble swallowing.   Eyes: Negative for eye problems and icterus.  Respiratory: Negative for cough, hemoptysis, shortness of breath and wheezing.   Cardiovascular: Negative for chest pain and leg swelling.  Gastrointestinal: Negative for abdominal pain, constipation, diarrhea, nausea and vomiting.  Genitourinary: Negative for bladder incontinence, difficulty urinating, dysuria, frequency and hematuria.   Musculoskeletal: Negative for back pain, gait problem, neck pain and neck stiffness.  Skin: Negative for itching and rash.  Neurological: Negative for dizziness, extremity weakness, gait problem, headaches, light-headedness and seizures.  Hematological: Negative for adenopathy. Does not bruise/bleed easily.  Psychiatric/Behavioral: Negative for confusion, depression and sleep disturbance. The patient is not nervous/anxious.     PHYSICAL EXAMINATION:  Blood pressure (!) 153/85, pulse 74, temperature 98.1 F (36.7 C), temperature source Temporal, resp. rate 18, height 4' 11" (1.499 m), weight 130 lb 4.8 oz (59.1 kg), SpO2 99 %.  ECOG PERFORMANCE STATUS: 1 - Symptomatic but completely ambulatory  Physical Exam  Constitutional: Oriented to person, place, and time and well-developed,  well-nourished, and in no distress.  HENT:  Head: Normocephalic and atraumatic.  Mouth/Throat: Oropharynx is clear and moist. No oropharyngeal exudate.  Eyes: Conjunctivae are normal. Right eye exhibits no discharge. Left eye exhibits no discharge. No scleral icterus.  Neck: Normal range of motion. Neck supple.  Cardiovascular: Normal rate, regular rhythm, normal heart sounds and intact distal pulses.   Pulmonary/Chest: Effort normal and breath sounds normal. No respiratory distress. No wheezes. No rales.  Abdominal: Soft. Bowel sounds are normal. Exhibits no distension and no mass. There is no tenderness.  Musculoskeletal: Normal range of motion. Exhibits no edema.  Lymphadenopathy:    No cervical adenopathy.  Neurological: Alert and oriented to person, place, and time. Exhibits normal muscle tone. Gait normal. Coordination normal.  Skin: Skin is warm and dry. No rash noted. Not diaphoretic. No erythema. No pallor.  Psychiatric: Mood, memory and judgment normal.  Vitals reviewed.  LABORATORY DATA: Lab Results  Component Value Date   WBC 8.3 03/10/2020   HGB 13.3 03/10/2020   HCT 40.3 03/10/2020   MCV 89.2 03/10/2020   PLT 241 03/10/2020      Chemistry  Component Value Date/Time   NA 140 03/10/2020 0824   NA 135 11/14/2019 0900   NA 139 09/16/2015 1348   K 4.0 03/10/2020 0824   K 4.0 09/16/2015 1348   CL 103 03/10/2020 0824   CO2 27 03/10/2020 0824   CO2 28 09/16/2015 1348   BUN 10 03/10/2020 0824   BUN 11 11/14/2019 0900   BUN 9.6 09/16/2015 1348   CREATININE 0.71 03/10/2020 0824   CREATININE 0.8 09/16/2015 1348      Component Value Date/Time   CALCIUM 9.3 03/10/2020 0824   CALCIUM 9.5 09/16/2015 1348   ALKPHOS 90 03/10/2020 0824   ALKPHOS 156 (H) 09/16/2015 1348   AST 18 03/10/2020 0824   AST 19 09/16/2015 1348   ALT 15 03/10/2020 0824   ALT 23 09/16/2015 1348   BILITOT 0.5 03/10/2020 0824   BILITOT 0.66 09/16/2015 1348       RADIOGRAPHIC  STUDIES:  No results found.   ASSESSMENT/PLAN:  This is a very pleasant 54 years old Hispanic female with extensive metastatic papillary thyroid carcinoma with multiple bilateral pulmonary nodules and masses as well as lymphadenopathy and a new suspicious recurrent local disease at the thyroidectomy.  Her cancer was initially diagnosed in 2008.  Dr. Earlie Server had a lengthy discussion with the patient about her current condition and treatment options.  The patient is not interested in any further treatment.  The patient wishes to continue under the care of hospice.  We will not schedule any follow-up appointments at this time but we will be available on an as-needed basis.  She will reach out to her PCP and hospice for refills of her tramadol and lexapro.   The patient was advised to call immediately if she has any concerning symptoms in the interval. The patient voices understanding of current disease status and treatment options and is in agreement with the current care plan. All questions were answered. The patient knows to call the clinic with any problems, questions or concerns. We can certainly see the patient much sooner if necessary  No orders of the defined types were placed in this encounter.    Cassandra L Heilingoetter, PA-C 03/10/20  ADDENDUM: Hematology/Oncology Attending: I had a face-to-face encounter with the patient today.  I recommended her care plan.  This is a very pleasant 54 years old Hispanic female with metastatic papillary thyroid carcinoma with widely metastatic disease in the lung with multiple bilateral pulmonary nodules and masses as well as lymphadenopathy. I had a lengthy discussion with the patient last visit and also during this time about her current condition and treatment options.  The patient elected to be on the hospice service and she is currently followed by the hospice service of [facility]. I agreed with her decision and give the patient the option  to call in the future if she has any concerning issues.   Disclaimer: This note was dictated with voice recognition software. Similar sounding words can inadvertently be transcribed and may be missed upon review. Eilleen Kempf, MD 03/10/20

## 2020-03-17 ENCOUNTER — Other Ambulatory Visit: Payer: Self-pay | Admitting: Pharmacist

## 2020-03-17 MED ORDER — IBUPROFEN 600 MG PO TABS: 600.0000 mg | ORAL_TABLET | Freq: Two times a day (BID) | ORAL | 0 refills | Status: DC | PRN

## 2020-03-17 MED FILL — IBUPROFEN 600 MG TABLET: 600 | 15 days supply | Qty: 30 | Fill #0

## 2020-04-03 ENCOUNTER — Ambulatory Visit: Payer: No Typology Code available for payment source

## 2020-04-04 ENCOUNTER — Ambulatory Visit: Payer: Self-pay | Attending: Nurse Practitioner

## 2020-04-04 ENCOUNTER — Other Ambulatory Visit: Payer: Self-pay

## 2020-04-08 ENCOUNTER — Telehealth: Payer: Self-pay | Admitting: Licensed Clinical Social Worker

## 2020-04-08 NOTE — Telephone Encounter (Signed)
Call placed to patient utilizing [employer] [place] QI#[id number] Spanish). LCSW introduced self and explained role at [facility]. Pt was informed of [facility] referral by PCP to address behavioral health and resource needs.   Pt reports managing anxiety and depression well. She receives strong support from family and is participating in hospice care who visits home weekly. Pt reports compliance with medications. Pt recently met with Financial Counselor to discuss eligibility for CAFA to assist with medical bills.   Pt reports concern regarding two lumps on throat, one in which is larger and hurts. Pt was offered an appointment tomorrow, 04/09/20; however, pt was unable to commit due to time of available appointment. LCSW emailed pt [facility] schedule to visit a Provider as a walk-in. Pt was very appreciative. LCSW encouraged pt engage in self-care activities. No additional concerns noted.

## 2020-04-09 ENCOUNTER — Ambulatory Visit: Payer: No Typology Code available for payment source | Admitting: Family Medicine

## 2020-04-15 ENCOUNTER — Telehealth: Payer: Self-pay | Admitting: Nurse Practitioner

## 2020-04-15 NOTE — Telephone Encounter (Signed)
Copied from [place] [phone]. Topic: General - Other >> Apr 15, 2020  1:34 PM Oneta Rack wrote:  Relation to pt: [place] [facility]  Call back number: [phone] fax [phone]   Reason for call:  Checking on the status of hospice plan of care orders. Re faxing orders to [phone] please note when received.

## 2020-04-15 NOTE — Telephone Encounter (Signed)
Pt was sent a letter from financial dept. Inform them, that the application they submitted was incomplete, since they were missing some documentation at the time of the appointment, Pt need to reschedule and resubmit all new papers and application for CAFA and OC, P.S. old documents has been sent back by mail to the Pt and Pt. need to make a new appt. 

## 2020-04-15 NOTE — Telephone Encounter (Signed)
Please f/u   Copied from [place] [phone]. Topic: General - Other >> Apr 15, 2020  1:34 PM Oneta Rack wrote:  Relation to pt: [place] [facility]  Call back number: [phone] fax [phone]   Reason for call:  Checking on the status of hospice plan of care orders. Re faxing orders to [phone] please note when received.

## 2020-04-25 NOTE — Telephone Encounter (Signed)
Will fax the form back once received.

## 2020-05-05 ENCOUNTER — Telehealth: Payer: Self-pay | Admitting: Nurse Practitioner

## 2020-05-05 NOTE — Telephone Encounter (Signed)
Please advise.  Copied from [place] [phone]. Topic: General - Other >> May 01, 2020 12:20 PM Jenny [name] wrote: PT needs to speak with Clifton James about her application status / please advise

## 2020-05-23 ENCOUNTER — Other Ambulatory Visit: Payer: Self-pay | Admitting: Family Medicine

## 2020-05-23 MED FILL — AMLODIPINE BESYLATE 5 MG TA: 5 | 30 days supply | Qty: 30 | Fill #2

## 2020-05-23 MED FILL — metFORMIN HCL 1000 MG TABS: 1000 | 30 days supply | Qty: 60 | Fill #2

## 2020-05-23 MED FILL — LOSARTAN POTASSIUM 100 MG T: 100 | 30 days supply | Qty: 30 | Fill #2

## 2020-05-23 MED FILL — ESCITALOPRAM 10 MG TABLET: 10 | 30 days supply | Qty: 30 | Fill #2

## 2020-05-23 MED FILL — IBUPROFEN 600 MG TABLET: 600 | 15 days supply | Qty: 30 | Fill #0

## 2020-06-02 ENCOUNTER — Other Ambulatory Visit: Payer: Self-pay

## 2020-06-02 ENCOUNTER — Ambulatory Visit: Payer: No Typology Code available for payment source | Attending: Nurse Practitioner

## 2020-06-11 MED FILL — traMADol HCL 50 MG TABS: 50 | 30 days supply | Qty: 120 | Fill #0

## 2020-06-11 MED FILL — AMLODIPINE BESYLATE 5 MG TA: 5 | 30 days supply | Qty: 30 | Fill #2

## 2020-06-13 MED FILL — ?IBUPROFEN 600 MG TABLETS: 600 | 15 days supply | Qty: 30 | Fill #0

## 2020-07-11 MED FILL — AMLODIPINE BESYLATE 5 MG TA: 5 | 30 days supply | Qty: 30 | Fill #3

## 2020-07-11 MED FILL — ESCITALOPRAM 10 MG TABLET: 10 | 30 days supply | Qty: 30 | Fill #3

## 2020-07-11 MED FILL — METFORMIN HCL 1000 MG TABS: 1000 | 30 days supply | Qty: 60 | Fill #3

## 2020-07-25 MED FILL — METFORMIN HCL 1000 MG TABS: 1000 | 30 days supply | Qty: 60 | Fill #3

## 2020-07-25 MED FILL — ?AMLODIPINE BESYLATE 5MG TA: 5 | 30 days supply | Qty: 30 | Fill #3

## 2020-07-25 MED FILL — LOSARTAN POTASSIUM 100 MG T: 100 | 30 days supply | Qty: 30 | Fill #3

## 2020-07-29 MED FILL — ESCITALOPRAM 10 MG TABLET: 10 | 30 days supply | Qty: 30 | Fill #3

## 2020-08-25 ENCOUNTER — Other Ambulatory Visit: Payer: Self-pay | Admitting: Nurse Practitioner

## 2020-08-25 MED FILL — ?IBUPROFEN 600 MG TABLETS: 600 | 15 days supply | Qty: 30 | Fill #1

## 2020-08-25 MED FILL — ESCITALOPRAM 10 MG TABLET: 10 | 30 days supply | Qty: 30 | Fill #4

## 2020-08-25 MED FILL — LOSARTAN POTASSIUM 100 MG T: 100 | 30 days supply | Qty: 30 | Fill #4

## 2020-08-25 MED FILL — AMLODIPINE BESYLATE 5 MG TA: 5 | 30 days supply | Qty: 30 | Fill #4

## 2020-08-25 MED FILL — METFORMIN HCL 1000 MG TABS: 1000 | 30 days supply | Qty: 60 | Fill #4

## 2020-08-25 NOTE — Telephone Encounter (Signed)
Please review for refill. Refill not delegated. Last refill:02/15/20

## 2020-08-25 NOTE — Telephone Encounter (Signed)
Copied from [place] [phone]. Topic: Quick Communication - Rx Refill/Question >> Aug 25, 2020  2:16 PM Leward Quan A wrote: Medication: traMADol (ULTRAM) 50 MG tablet   Has the patient contacted their pharmacy? Yes.   (Agent: If no, request that the patient contact the pharmacy for the refill.) (Agent: If yes, when and what did the pharmacy advise?)  Preferred Pharmacy (with phone number or street name): [place], [address]  Phone:  [phone] Fax:  [phone]     Agent: Please be advised that RX refills may take up to 3 business days. We ask that you follow-up with your pharmacy.

## 2020-08-27 ENCOUNTER — Telehealth: Payer: Self-pay | Admitting: Nurse Practitioner

## 2020-08-27 ENCOUNTER — Telehealth: Payer: Self-pay | Admitting: Medical Oncology

## 2020-08-27 MED ORDER — TRAMADOL HCL 50 MG PO TABS: 50.0000 mg | ORAL_TABLET | Freq: Every day | ORAL | 0 refills | Status: AC | PRN

## 2020-08-27 MED FILL — traMADol HCL 50 MG TABS: 50 | 30 days supply | Qty: 30 | Fill #0

## 2020-08-27 NOTE — Telephone Encounter (Signed)
Spoke to [employer] and informed PCP does not want to be the attending provider.

## 2020-08-27 NOTE — Telephone Encounter (Signed)
Hospice Attending? Pt was on hospice and then went off hospice and now wants to go back on Hospice services . PCP Jenny not serve as attending.   Jenny [name] serve as attending?

## 2020-08-27 NOTE — Telephone Encounter (Signed)
Copied from [place] [phone]. Topic: Quick Communication - See Telephone Encounter >> Aug 25, 2020  3:25 PM Loma Boston wrote: CRM for notification. See Telephone encounter for: 08/25/20.[facility], Crystal calling asking for her pt to be followed and be hospice attending  dr and agee that pt is terminal if so respond with a verbal [phone] you can leave a detailed message secure line. >> Aug 26, 2020  9:22 AM Keene Breath wrote: Calling again to check the status of her request from yesterday.  Please call to discuss at [phone]

## 2020-08-27 NOTE — Telephone Encounter (Signed)
This was NOT converted to a telephone encounter.   Crystal called Monday to ask if Army Melia will be the attending provider for hospice?  Crystal needs to know something asap,as the pt is waiting and they need to see her.  cb [phone]

## 2020-08-27 NOTE — Telephone Encounter (Signed)
The answer is no.  I will not be the hospice attending.  Thank you.

## 2020-08-29 NOTE — Telephone Encounter (Signed)
Crystal with [facility]  is calling to ask if Army Melia will be attending physician. Crystal states that Zelda had declined to be attending physician. Patient oncologist was asked to be attending physician as well. The oncologist declined as well.  Crystal is asking if Zelda would consider being the patient attending physician to get her connected back with [facility] collective care hospice services? Crystal states if Zelda is in need of an OV with the patient that could be arranged.  Just wanting to know if Zelda could be the attending physician. Since she had agreed previously to be the attending psychian  Without an attending she can not be seen by hospice. CB- [phone]

## 2020-09-01 NOTE — Telephone Encounter (Signed)
Sherry at [facility] notified.

## 2020-09-02 ENCOUNTER — Telehealth: Payer: Self-pay

## 2020-09-02 NOTE — Telephone Encounter (Addendum)
Left message on voicemail for Crystal to return call.   - Would advise to find other options for Hospice approach and Palliative Care. PCP has declined.

## 2020-09-02 NOTE — Telephone Encounter (Signed)
Call placed to Crystal/[facility] and informed her that Ms Raul Del, NP will not be the attending for hospice care.  Crystal explained that a provider is needed who will follow the patient through hospice and then as PCP in the event that the patient decides to discontinue hospice services.  Their doctors with hospice are not PCPs. They have checked with  other community physicians who serve as attending providers for  [facility] hospice patients  but they have not been able to find one that will follow this patient. She explained that the patient may need to find a new PCP who will follow her through hospice.  This CM spoke to [place] and explained the above situation.  He said he would check into the issue and get back to this CM.  Call received from [place] who stated that they found a provider - Dr Tomasa Hosteller who will serve as attending of record for the patient and they will schedule the admission visit

## 2020-09-02 NOTE — Telephone Encounter (Signed)
Please FU with [facility], Crystal. She is wanting to know if they can get pt to office to be seen would [place] accept pt to be Hospice attending. States at this point Oncologist has deferred and also Raul Del and this is a last effort to provide her Hospice. Wants a FU so she can refer or try other options for pt fu requested to know standing [phone]

## 2020-09-05 ENCOUNTER — Other Ambulatory Visit: Payer: Self-pay | Admitting: Internal Medicine

## 2020-09-05 MED FILL — traMADol HCL 50 MG TABS: 50 | 30 days supply | Qty: 120 | Fill #0

## 2020-09-15 MED FILL — traMADol HCL 50 MG TABS: 50 | 30 days supply | Qty: 120 | Fill #0

## 2020-09-19 MED FILL — LOSARTAN POTASSIUM 100 MG T: 100 | 30 days supply | Qty: 30 | Fill #5

## 2020-09-19 MED FILL — ESCITALOPRAM 10 MG TABLET: 10 | 30 days supply | Qty: 30 | Fill #5

## 2020-09-19 MED FILL — AMLODIPINE BESYLATE 5 MG TA: 5 | 30 days supply | Qty: 30 | Fill #5

## 2020-09-19 MED FILL — METFORMIN HCL 1000 MG TABS: 1000 | 30 days supply | Qty: 60 | Fill #5

## 2020-11-25 ENCOUNTER — Telehealth: Payer: Self-pay | Admitting: Internal Medicine

## 2020-11-25 NOTE — Telephone Encounter (Signed)
Release: [id number] Faxed medical records to [facility] @ fax [phone]

## 2021-02-01 IMAGING — CT CT ANGIO CHEST
2 of 7 series · 18 of 46 positions shown · IV contrast (omnipaque)
Comparison: Chest x-ray dated September 03, 2016. CTA chest dated
October 21, 2014.

CLINICAL DATA: Right-sided pleuritic chest pain and back pain for
the past 2 weeks. History of metastatic thyroid cancer.

EXAM:
CT ANGIOGRAPHY CHEST WITH CONTRAST
TECHNIQUE: Multidetector CT imaging of the chest was performed using the
standard protocol during bolus administration of intravenous
contrast. Multiplanar CT image reconstructions and MIPs were
obtained to evaluate the vascular anatomy.
CONTRAST:  57mL OMNIPAQUE IOHEXOL 350 MG/ML SOLN

[Series 7: thins · axial · 0.60mm/px · z∈[-192,+53]mm · 15 of 394 slices shown]
[im 22/394  lung]
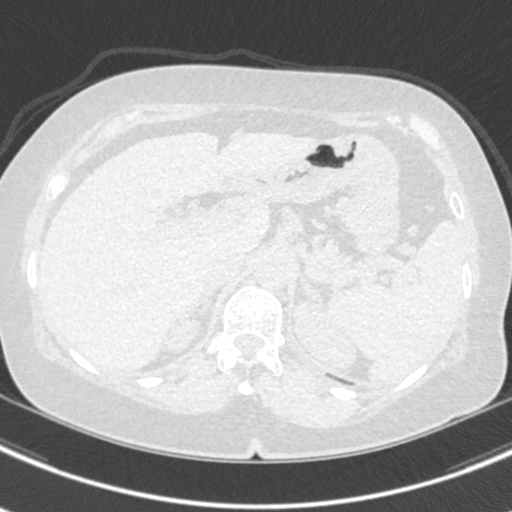
[im 44/394  soft-tissue]
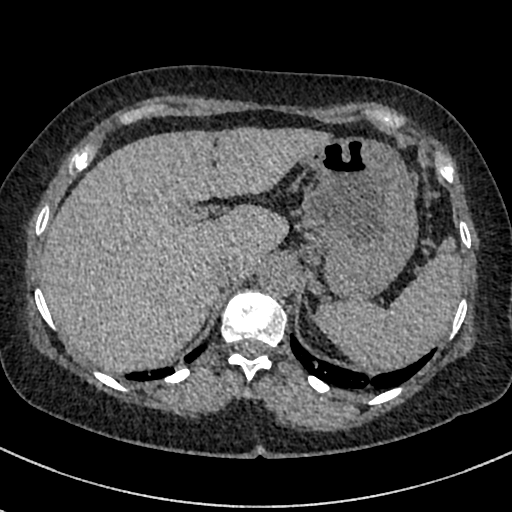
[im 66/394  lung]
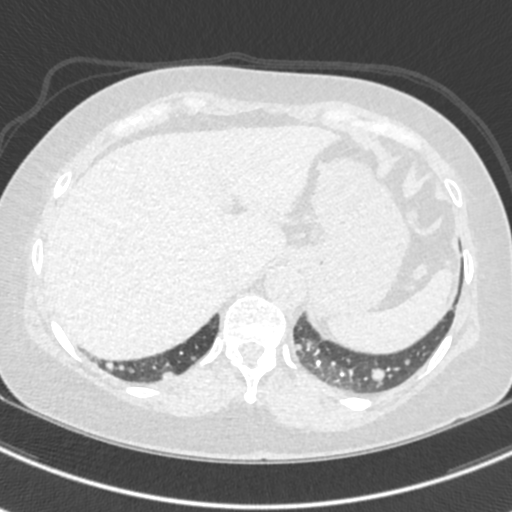
[im 88/394  soft-tissue]
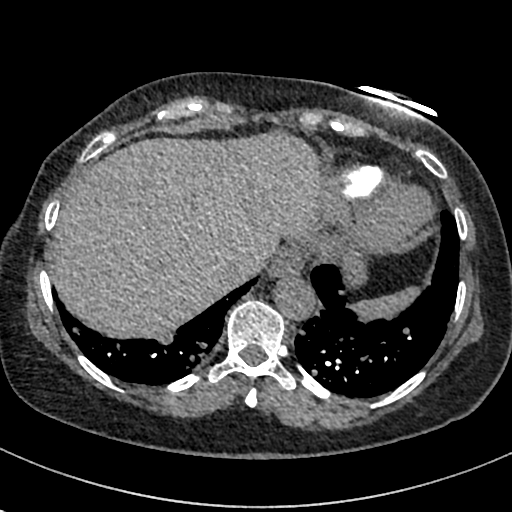
[im 132/394  lung]
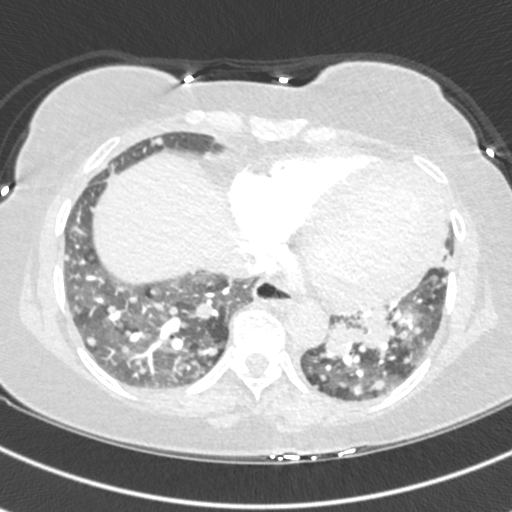
[im 153/394  soft-tissue]
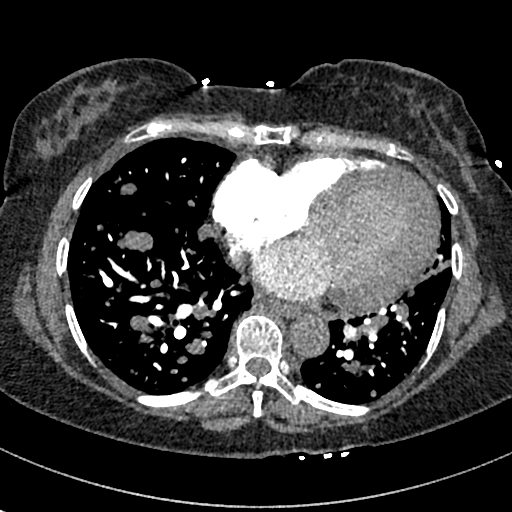
[im 175/394  lung]
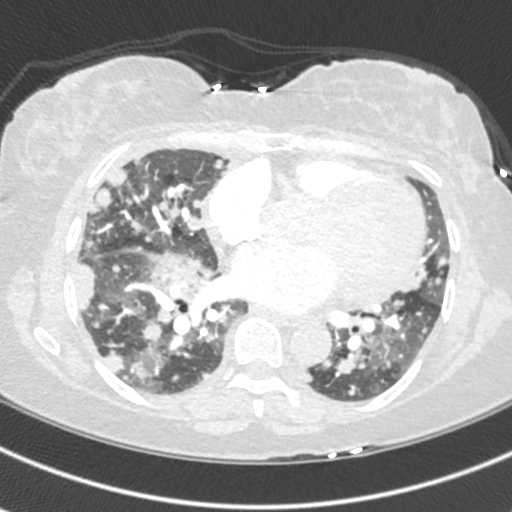
[im 197/394  soft-tissue]
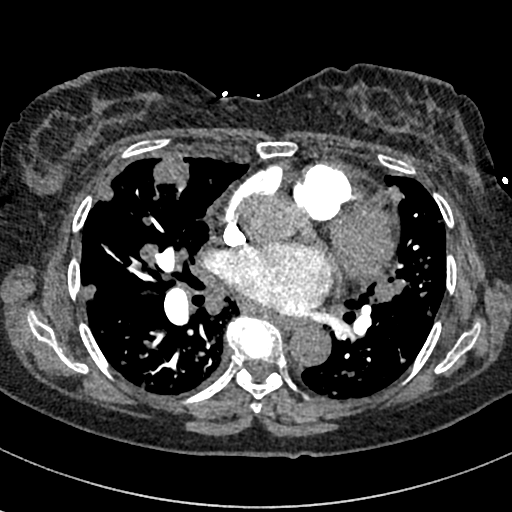
[im 219/394  lung]
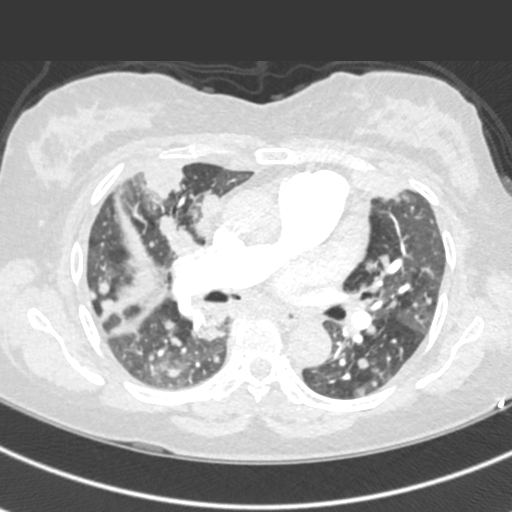
[im 241/394  soft-tissue]
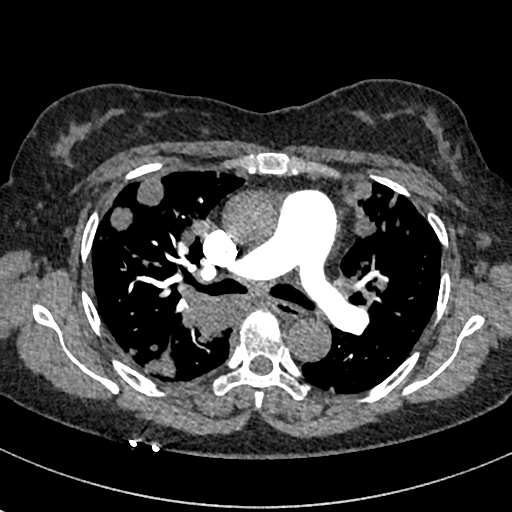
[im 263/394  lung]
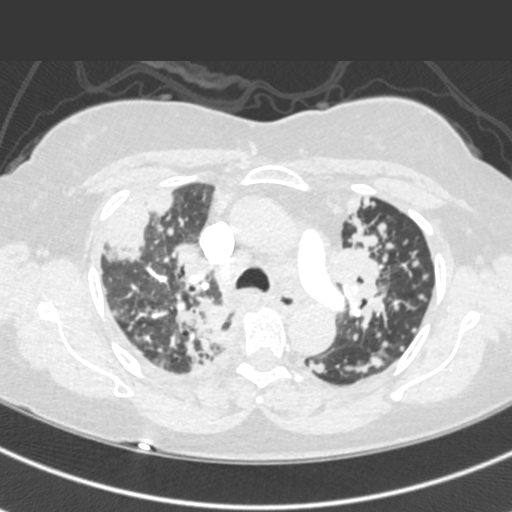
[im 306/394  soft-tissue]
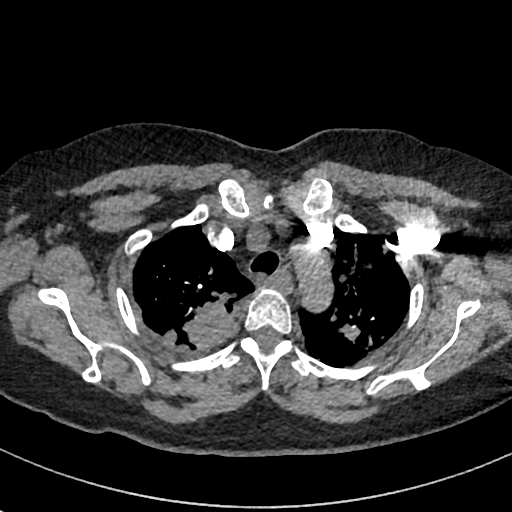
[im 328/394  lung]
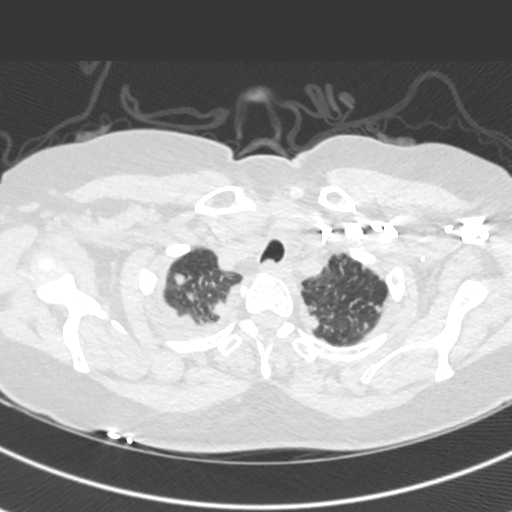
[im 350/394  soft-tissue]
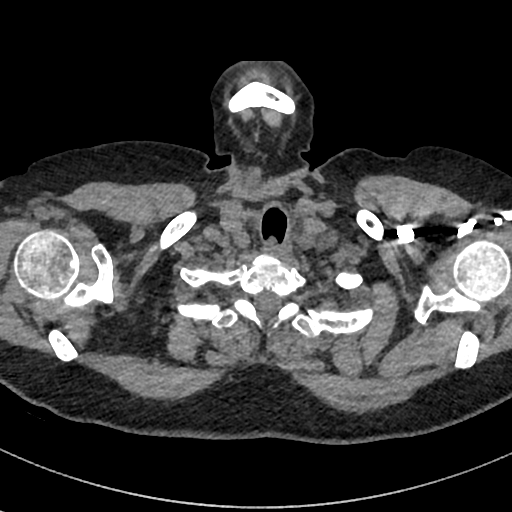
[im 372/394  lung]
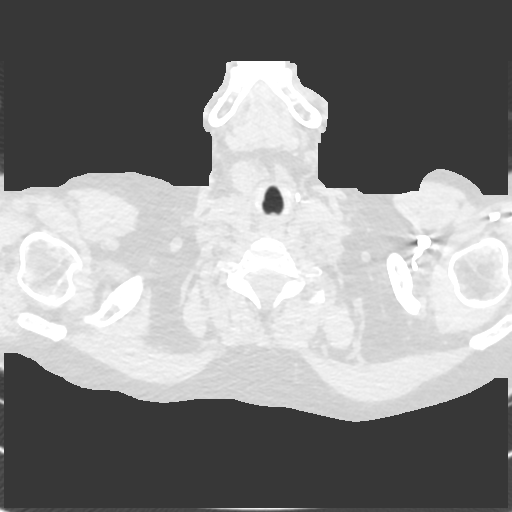

[Series 8: cor · coronal · 0.53mm/px · 3 of 117 slices shown]
[im 30/117  soft-tissue]
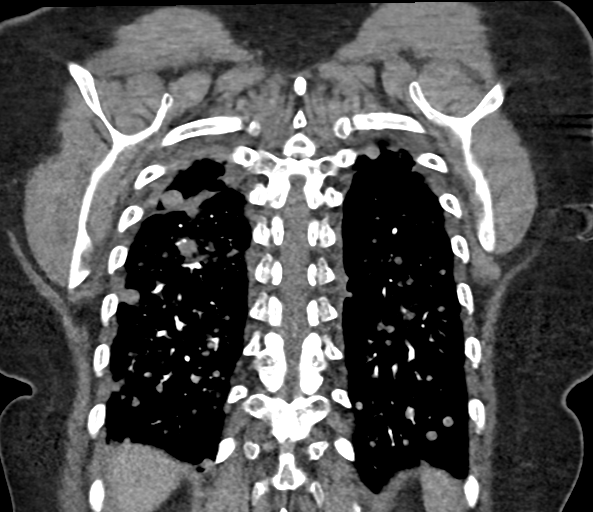
[im 59/117  soft-tissue]
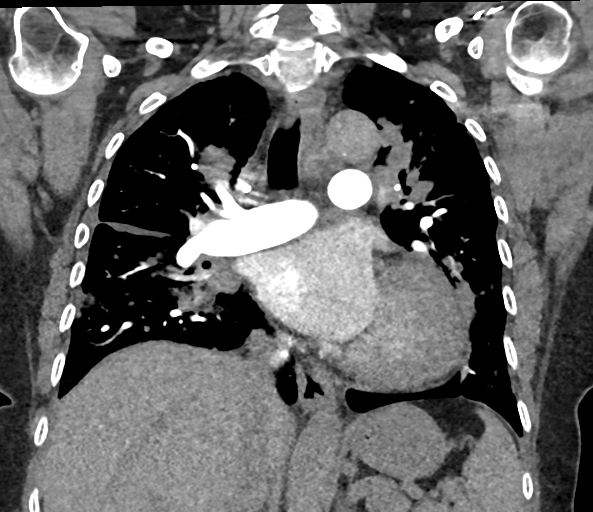
[im 88/117  soft-tissue]
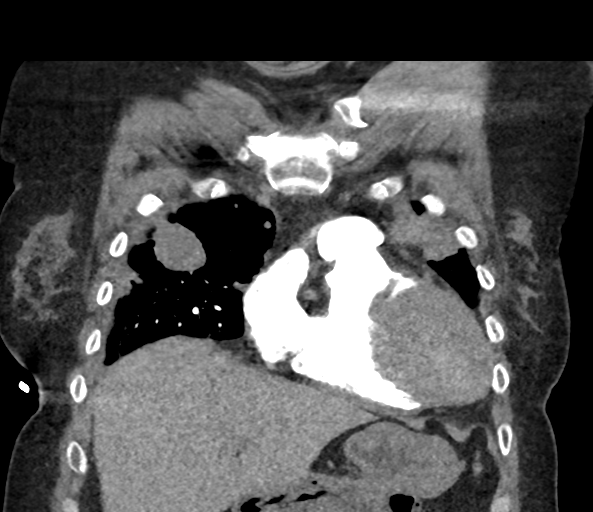

[18 of 46 positions shown; findings below may reference images not displayed]

FINDINGS: Cardiovascular: Satisfactory opacification of the pulmonary arteries
to the segmental level. Small nonocclusive filling defects in the
bilateral upper lobe segmental pulmonary arteries (series 6, image
48). No additional pulmonary emboli. Narrowing of the left anterior
and apical segmental pulmonary arteries due to metastatic disease.
Mildly enlarged main pulmonary artery measuring up to 3.3 cm in
diameter. Mild cardiomegaly. No pericardial effusion. No thoracic
aortic aneurysm.

Mediastinum/Nodes: Multiple enlarged right hilar lymph nodes
measuring up to 1.8 cm in short axis, new since the prior study.
Right paratracheal lymph node measuring up to 9 mm in short axis.
Left paratracheal lymph node measuring 1.9 cm in short axis. No
enlarged axillary lymph nodes. Prior thyroidectomy. 2.5 x 2.3 cm
soft tissue mass on the right anterior to the thyroidectomy bed. The
trachea and esophagus demonstrate no significant findings.

Lungs/Pleura: Innumerable pulmonary nodules and masses throughout
both lungs, increased in size and number since 4021. Scattered
perilymphatic nodularity, most prominent in the left upper lobe.
Nodular thickening of the right major and minor fissures. Mild
bronchiectasis in the anterior left upper lobe. No focal
consolidation, pleural effusion, or pneumothorax.

Upper Abdomen: No acute abnormality.

Musculoskeletal: No chest wall abnormality. No acute or significant
osseous findings.

Review of the MIP images confirms the above findings.
IMPRESSION: 1. Small nonocclusive bilateral upper lobe segmental pulmonary
emboli.
2. Progressive widespread pulmonary and nodal metastatic disease.
3. New right-sided 2.5 cm soft tissue mass anterior to the
thyroidectomy bed consistent with recurrent primary or metastatic
disease.

These results will be called to the ordering clinician or
representative by the Radiologist Assistant, and communication
documented in the PACS or [REDACTED].

## 2021-03-13 ENCOUNTER — Other Ambulatory Visit: Payer: Self-pay
